# Patient Record
Sex: Female | Born: 1975 | Race: Black or African American | Hispanic: No | Marital: Married | Smoking: Never smoker
Health system: Southern US, Community
[De-identification: ages and names within clinical notes are randomized; demographics above are authoritative.]

## PROBLEM LIST (undated history)

## (undated) DIAGNOSIS — K219 Gastro-esophageal reflux disease without esophagitis: Secondary | ICD-10-CM

## (undated) DIAGNOSIS — R51 Headache: Secondary | ICD-10-CM

## (undated) DIAGNOSIS — Z87442 Personal history of urinary calculi: Secondary | ICD-10-CM

## (undated) DIAGNOSIS — E119 Type 2 diabetes mellitus without complications: Secondary | ICD-10-CM

## (undated) DIAGNOSIS — D649 Anemia, unspecified: Secondary | ICD-10-CM

## (undated) DIAGNOSIS — R519 Headache, unspecified: Secondary | ICD-10-CM

## (undated) DIAGNOSIS — J45909 Unspecified asthma, uncomplicated: Secondary | ICD-10-CM

## (undated) DIAGNOSIS — M1990 Unspecified osteoarthritis, unspecified site: Secondary | ICD-10-CM

## (undated) DIAGNOSIS — I10 Essential (primary) hypertension: Secondary | ICD-10-CM

## (undated) HISTORY — PX: DIAGNOSTIC LAPAROSCOPY: SUR761

## (undated) HISTORY — PX: CHOLECYSTECTOMY: SHX55

## (undated) HISTORY — PX: OTHER SURGICAL HISTORY: SHX169

## (undated) HISTORY — PX: ABDOMINAL HYSTERECTOMY: SHX81

## (undated) HISTORY — PX: HERNIA REPAIR: SHX51

## (undated) HISTORY — PX: CARDIAC CATHETERIZATION: SHX172

## (undated) HISTORY — DX: Essential (primary) hypertension: I10

## (undated) HISTORY — PX: SINUS SURGERY WITH INSTATRAK: SHX5215

## (undated) HISTORY — DX: Unspecified asthma, uncomplicated: J45.909

## (undated) HISTORY — DX: Type 2 diabetes mellitus without complications: E11.9

---

## 2016-07-24 ENCOUNTER — Other Ambulatory Visit (HOSPITAL_COMMUNITY): Payer: Self-pay | Admitting: Surgery

## 2016-08-01 ENCOUNTER — Ambulatory Visit (HOSPITAL_COMMUNITY)
Admission: RE | Admit: 2016-08-01 | Discharge: 2016-08-01 | Disposition: A | Discharge status: Home / Self Care | Payer: BC Managed Care – PPO | Source: Ambulatory Visit | Attending: Surgery | Admitting: Surgery

## 2016-08-01 ENCOUNTER — Other Ambulatory Visit: Payer: Self-pay

## 2016-08-01 DIAGNOSIS — Z01818 Encounter for other preprocedural examination: Secondary | ICD-10-CM | POA: Insufficient documentation

## 2016-08-15 ENCOUNTER — Encounter: Payer: BC Managed Care – PPO | Attending: Surgery | Admitting: Registered"

## 2016-08-15 ENCOUNTER — Encounter: Payer: Self-pay | Admitting: Registered"

## 2016-08-15 DIAGNOSIS — Z01818 Encounter for other preprocedural examination: Secondary | ICD-10-CM | POA: Diagnosis not present

## 2016-08-15 DIAGNOSIS — E119 Type 2 diabetes mellitus without complications: Secondary | ICD-10-CM | POA: Insufficient documentation

## 2016-08-15 DIAGNOSIS — K219 Gastro-esophageal reflux disease without esophagitis: Secondary | ICD-10-CM | POA: Insufficient documentation

## 2016-08-15 DIAGNOSIS — I10 Essential (primary) hypertension: Secondary | ICD-10-CM | POA: Diagnosis not present

## 2016-08-15 DIAGNOSIS — E669 Obesity, unspecified: Secondary | ICD-10-CM

## 2016-08-15 DIAGNOSIS — Z713 Dietary counseling and surveillance: Secondary | ICD-10-CM | POA: Insufficient documentation

## 2016-08-15 DIAGNOSIS — Z6841 Body Mass Index (BMI) 40.0 and over, adult: Secondary | ICD-10-CM | POA: Insufficient documentation

## 2016-08-15 NOTE — Progress Notes (Signed)
Pre-Op Assessment Visit:  Pre-Operative RYGB Surgery  Medical Nutrition Therapy:  Appt start time: 11:05  End time:  12:15  Patient was seen on 08/15/2016 for Pre-Operative Nutrition Assessment. Assessment and letter of approval [facility] to [facility] coordinator on 08/15/2016.   Pt expectation of surgery: to be more active, healthier to cause remission of diabetes  Pt expectation of Dietitian: help to stabilize blood sugar number's maintain stabilization  Start weight at [facility]: 248.5 BMI: 42.99   Pt arrived late to appt reporting that she ran into traffic traveling from [place] and got lost on where to come for today's visit. Pt states she has had diabetes since 2005. Pt reports her A1c has increased from 7.4 to 11 and currently on new medications to help manage her diabetes better. Pt reports her blood sugar dropped this week to 40's. Pt states she checks her blood sugar 4 times a day: FBS (130-140) and after meals (180-220).  Pt states she hasn't been able to get into the gym and walking as physical activity is limited due to back injury.  Pt reports sweet tea is her weakness. Pt states new diabetic pill (Januvia) makes her drink a lot of water. Pt states she used to skip dinner now that she's taking more medications it makes her eat more. 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   Pt is unsure of how many visits she needs with us prior to surgery.    24 hr Dietary Recall: First Meal: apple, grilled chicken breast, grits, eggs Snack: none Second Meal: grilled chicken salad, burger, fries Snack: none Third Meal: meat, 2 vegetables Snack: none Beverages: water, juice, sweet tea  Encouraged to engage in 150 minutes of moderate physical activity including cardiovascular and weight baring weekly  Handouts given during visit include:  . Pre-Op Goals . Bariatric Surgery Protein Shakes . Vitamin and Mineral  Supplements  During the appointment today the following Pre-Op Goals were reviewed with the patient: . Maintain or lose weight as instructed by your surgeon . Make healthy food choices . Begin to limit portion sizes . Limited concentrated sugars and fried foods . Keep fat/sugar in the single digits per serving on          food labels . Practice CHEWING your food  (aim for 30 chews per bite or until applesauce consistency) . Practice not drinking 15 minutes before, during, and 30 minutes after each meal/snack . Avoid all carbonated beverages  . Avoid/limit caffeinated beverages  . Avoid all sugar-sweetened beverages . Consume 3 meals per day; eat every 3-5 hours . Make a list of non-food related activities . Aim for 64-100 ounces of FLUID daily  . Aim for at least 60-80 grams of PROTEIN daily . Look for a liquid protein source that contain ?15 g protein and ?5 g carbohydrate  (ex: shakes, drinks, shots)   Goals: - Look at your schedule and find consistent times that you will be able to eat/ take vitamins and supplements 5 times a day.  Follow diet recommendations listed below  Energy and Macronutrient Recomendations: Calories: 1600 Carbohydrate: 180 Protein: 120 Fat: 44  Demonstrated degree of understanding via:  Teach Back   Teaching Method Utilized:  Visual Auditory Hands on  Barriers to learning/adherence to lifestyle change: work schedule  Patient to call the [facility] to enroll in Pre-Op and Post-Op Nutrition Education when surgery date is scheduled.

## 2016-08-15 NOTE — Patient Instructions (Signed)
-   Look at your schedule and find consistent times that you will be able to eat/ take vitamins and supplements 5 times a day.

## 2016-10-29 NOTE — Progress Notes (Signed)
Please place orders in EPIC as patient is being scheduled for a pre-op appointment! Thank you! 

## 2016-11-04 ENCOUNTER — Encounter: Payer: BC Managed Care – PPO | Attending: Surgery | Admitting: Skilled Nursing Facility1

## 2016-11-04 DIAGNOSIS — I10 Essential (primary) hypertension: Secondary | ICD-10-CM | POA: Diagnosis not present

## 2016-11-04 DIAGNOSIS — K219 Gastro-esophageal reflux disease without esophagitis: Secondary | ICD-10-CM | POA: Diagnosis not present

## 2016-11-04 DIAGNOSIS — Z6841 Body Mass Index (BMI) 40.0 and over, adult: Secondary | ICD-10-CM | POA: Diagnosis not present

## 2016-11-04 DIAGNOSIS — Z01818 Encounter for other preprocedural examination: Secondary | ICD-10-CM | POA: Insufficient documentation

## 2016-11-04 DIAGNOSIS — E119 Type 2 diabetes mellitus without complications: Secondary | ICD-10-CM

## 2016-11-04 DIAGNOSIS — Z713 Dietary counseling and surveillance: Secondary | ICD-10-CM | POA: Diagnosis not present

## 2016-11-05 ENCOUNTER — Encounter: Payer: Self-pay | Admitting: Skilled Nursing Facility1

## 2016-11-05 NOTE — Progress Notes (Signed)
Pre-Operative Nutrition Class:  Appt start time: 5248   End time:  1830.  Patient was seen on 11/04/2016 for Pre-Operative Bariatric Surgery Education [facility].   Pt arrived 1 hours late for class stating she got lost. When dietitian calmly explained she would have to be rescheduled due to her having missed very important information about medications she became hysterical: crying and kicking the wall. Dietitian calmed/conforted her and told her she could come into class at that point she calmed down easily and sat in class without issue.   Surgery date: 11/18/2016 Surgery type: RYGB Start weight at [facility]: 248.5 Weight today: pt arrived too late  Samples given per MNT protocol. Patient educated on appropriate usage: Bariatric Advantage Calcium  Lot # [id number] Exp: dec-11-2016  Celebrate Vitamins Multivitamin Lot # [id number] Exp: 09/2017  Renee Pain Protein Shake Lot # [id number] Exp: nov-05-2016  The following the learning objectives were met by the patient during this course:  Identify Pre-Op Dietary Goals and will begin 2 weeks pre-operatively  Identify appropriate sources of fluids and proteins   State protein recommendations and appropriate sources pre and post-operatively  Identify Post-Operative Dietary Goals and will follow for 2 weeks post-operatively  Identify appropriate multivitamin and calcium sources  Describe the need for physical activity post-operatively and will follow MD recommendations  State when to call healthcare provider regarding medication questions or post-operative complications  Handouts given during class include:  Pre-Op Bariatric Surgery Diet Handout  Protein Shake Handout  Post-Op Bariatric Surgery Nutrition Handout  [facility] Program Information Flyer  Support Group Information Flyer  [facility] Bariatric Supplements Price List  Follow-Up Plan: [facility] diet advancement per MD.

## 2016-11-08 NOTE — Progress Notes (Signed)
Please place orders in EPIC as patient has a pre-op appointment on 11/13/2016! Thank you!

## 2016-11-11 ENCOUNTER — Ambulatory Visit: Payer: Self-pay | Admitting: Surgery

## 2016-11-11 NOTE — Patient Instructions (Signed)
Margarette CanadaShektha W Bashore  11/11/2016   Your procedure is scheduled on: 11/18/2016    Report to [facility] Main  Entrance [place]  elevators to 3rd floor to  Short Stay Center at   0515 AM.    Call this number if you have problems the morning of surgery [phone]    Remember: ONLY 1 PERSON MAY GO WITH YOU TO SHORT STAY TO GET  READY MORNING OF YOUR SURGERY.  Do not eat food or drink liquids :After Midnight.     Take these medicines the morning of surgery with A SIP OF WATER: aMLODIPINE ( NORVASC), nASAL SPRAY, iMDUR, LINZESS, pROTONIX, PRILOSEC, METOPROLOL ( lOPRESSOR)  DO NOT TAKE ANY DIABETIC MEDICATIONS DAY OF YOUR SURGERY                               You may not have any metal on your body including hair pins and              piercings  Do not wear jewelry, make-up, lotions, powders or perfumes, deodorant             Do not wear nail polish.  Do not shave  48 hours prior to surgery.                 Do not bring valuables to the hospital. [facility] IS NOT             RESPONSIBLE   FOR VALUABLES.  Contacts, dentures or bridgework may not be worn into surgery.  Leave suitcase in the car. After surgery it may be brought to your room.                       Please read over the following fact sheets you were given: _____________________________________________________________________             [facility] - Preparing for Surgery Before surgery, you can play an important role.  Because skin is not sterile, your skin needs to be as free of germs as possible.  You can reduce the number of germs on your skin by washing with CHG (chlorahexidine gluconate) soap before surgery.  CHG is an antiseptic cleaner which kills germs and bonds with the skin to continue killing germs even after washing. Please DO NOT use if you have an allergy to CHG or antibacterial soaps.  If your skin becomes reddened/irritated stop using the CHG and inform your nurse when you arrive  at Short Stay. Do not shave (including legs and underarms) for at least 48 hours prior to the first CHG shower.  You may shave your face/neck. Please follow these instructions carefully:  1.  Shower with CHG Soap the night before surgery and the  morning of Surgery.  2.  If you choose to wash your hair, wash your hair first as usual with your  normal  shampoo.  3.  After you shampoo, rinse your hair and body thoroughly to remove the  shampoo.                           4.  Use CHG as you would any other liquid soap.  You can apply chg directly  to the skin and wash  Gently with a scrungie or clean washcloth.  5.  Apply the CHG Soap to your body ONLY FROM THE NECK DOWN.   Do not use on face/ open                           Wound or open sores. Avoid contact with eyes, ears mouth and genitals (private parts).                       Wash face,  Genitals (private parts) with your normal soap.             6.  Wash thoroughly, paying special attention to the area where your surgery  will be performed.  7.  Thoroughly rinse your body with warm water from the neck down.  8.  DO NOT shower/wash with your normal soap after using and rinsing off  the CHG Soap.                9.  Pat yourself dry with a clean towel.            10.  Wear clean pajamas.            11.  Place clean sheets on your bed the night of your first shower and do not  sleep with pets. Day of Surgery : Do not apply any lotions/deodorants the morning of surgery.  Please wear clean clothes to the hospital/surgery center.  FAILURE TO FOLLOW THESE INSTRUCTIONS MAY RESULT IN THE CANCELLATION OF YOUR SURGERY PATIENT SIGNATURE_________________________________  NURSE SIGNATURE__________________________________  ________________________________________________________________________ How to Manage Your Diabetes Before and After Surgery  Why is it important to control my blood sugar before and after surgery? . Improving  blood sugar levels before and after surgery helps healing and can limit problems. . A way of improving blood sugar control is eating a healthy diet by: o  Eating less sugar and carbohydrates o  Increasing activity/exercise o  Talking with your doctor about reaching your blood sugar goals . High blood sugars (greater than 180 mg/dL) can raise your risk of infections and slow your recovery, so you will need to focus on controlling your diabetes during the weeks before surgery. . Make sure that the doctor who takes care of your diabetes knows about your planned surgery including the date and location.  How do I manage my blood sugar before surgery? . Check your blood sugar at least 4 times a day, starting 2 days before surgery, to make sure that the level is not too high or low. o Check your blood sugar the morning of your surgery when you wake up and every 2 hours until you get to the Short Stay unit. . If your blood sugar is less than 70 mg/dL, you will need to treat for low blood sugar: o Do not take insulin. o Treat a low blood sugar (less than 70 mg/dL) with  cup of clear juice (cranberry or apple), 4 glucose tablets, OR glucose gel. o Recheck blood sugar in 15 minutes after treatment (to make sure it is greater than 70 mg/dL). If your blood sugar is not greater than 70 mg/dL on recheck, call [phone] for further instructions. . Report your blood sugar to the short stay nurse when you get to Short Stay.  . If you are admitted to the hospital after surgery: o Your blood sugar will be checked by the staff and you will probably be  given insulin after surgery (instead of oral diabetes medicines) to make sure you have good blood sugar levels. o The goal for blood sugar control after surgery is 80-180 mg/dL.   WHAT DO I DO ABOUT MY DIABETES MEDICATION?  Marland Kitchen. Do not take oral diabetes medicines (pills) the morning of surgery.  . THE NIGHT BEFORE SURGERY, take     units of        insulin.       . THE MORNING OF SURGERY, take   units of         insulin.  . The day of surgery, do not take other diabetes injectables, including Byetta (exenatide), Bydureon (exenatide ER), Victoza (liraglutide), or Trulicity (dulaglutide).  .    Patient Signature:  Date:   Nurse Signature:  Date:   Reviewed and Endorsed by [facility] Patient Education Committee, August 2015

## 2016-11-13 ENCOUNTER — Encounter (HOSPITAL_COMMUNITY): Payer: Self-pay

## 2016-11-13 ENCOUNTER — Encounter (HOSPITAL_COMMUNITY)
Admission: RE | Admit: 2016-11-13 | Discharge: 2016-11-13 | Disposition: A | Discharge status: Home / Self Care | Payer: BC Managed Care – PPO | Source: Ambulatory Visit | Attending: Surgery | Admitting: Surgery

## 2016-11-13 DIAGNOSIS — Z01818 Encounter for other preprocedural examination: Secondary | ICD-10-CM | POA: Insufficient documentation

## 2016-11-13 HISTORY — DX: Unspecified osteoarthritis, unspecified site: M19.90

## 2016-11-13 HISTORY — DX: Headache, unspecified: R51.9

## 2016-11-13 HISTORY — DX: Headache: R51

## 2016-11-13 HISTORY — DX: Personal history of urinary calculi: Z87.442

## 2016-11-13 HISTORY — DX: Anemia, unspecified: D64.9

## 2016-11-13 HISTORY — DX: Gastro-esophageal reflux disease without esophagitis: K21.9

## 2016-11-13 LAB — CBC WITH DIFFERENTIAL/PLATELET
BASOS ABS: 0 10*3/uL (ref 0.0–0.1)
Basophils Relative: 0 %
EOS PCT: 1 %
Eosinophils Absolute: 0.1 10*3/uL (ref 0.0–0.7)
HEMATOCRIT: 41.3 % (ref 36.0–46.0)
Hemoglobin: 13.9 g/dL (ref 12.0–15.0)
LYMPHS PCT: 28 %
Lymphs Abs: 3 10*3/uL (ref 0.7–4.0)
MCH: 28.9 pg (ref 26.0–34.0)
MCHC: 33.7 g/dL (ref 30.0–36.0)
MCV: 85.9 fL (ref 78.0–100.0)
MONO ABS: 0.5 10*3/uL (ref 0.1–1.0)
MONOS PCT: 4 %
NEUTROS ABS: 7.2 10*3/uL (ref 1.7–7.7)
Neutrophils Relative %: 67 %
PLATELETS: 323 10*3/uL (ref 150–400)
RBC: 4.81 MIL/uL (ref 3.87–5.11)
RDW: 13.5 % (ref 11.5–15.5)
WBC: 10.8 10*3/uL — ABNORMAL HIGH (ref 4.0–10.5)

## 2016-11-13 LAB — COMPREHENSIVE METABOLIC PANEL
ALT: 17 U/L (ref 14–54)
ANION GAP: 7 (ref 5–15)
AST: 18 U/L (ref 15–41)
Albumin: 4.2 g/dL (ref 3.5–5.0)
Alkaline Phosphatase: 54 U/L (ref 38–126)
BILIRUBIN TOTAL: 0.5 mg/dL (ref 0.3–1.2)
BUN: 8 mg/dL (ref 6–20)
CHLORIDE: 105 mmol/L (ref 101–111)
CO2: 27 mmol/L (ref 22–32)
Calcium: 9.4 mg/dL (ref 8.9–10.3)
Creatinine, Ser: 0.63 mg/dL (ref 0.44–1.00)
Glucose, Bld: 83 mg/dL (ref 65–99)
POTASSIUM: 4 mmol/L (ref 3.5–5.1)
Sodium: 139 mmol/L (ref 135–145)
TOTAL PROTEIN: 7.5 g/dL (ref 6.5–8.1)

## 2016-11-13 LAB — GLUCOSE, CAPILLARY: GLUCOSE-CAPILLARY: 97 mg/dL (ref 65–99)

## 2016-11-13 NOTE — H&P (Signed)
C[facility] Patient #: [id number] DOB: 1975/06/16 Married / Language: English / Race: Black or African American Female  History of Present Illness The patient is a 41 year old female who presents for a bariatric Palmer evaluation. She is a school bus driver and is [facility] by internist Crystal CornfieldStephanie William HamburgerJan Palmer. She has been followed for DM type 2, HTN, GERD and constipation. She has tried many diets with limited success and is most frustrated by her DM. She has GER treated with Protonix. Her constipation has required Linzess. Her type II DM has been poorly controlled with insulin. This has contributed to her intertrigo for which she takes nystatin powder.   Prior Palmer includes lap chole, navel hernia, and four c sections. Her weight is 252 and her BMI is 42.6. I discussed both roux en Y and sleeve gastrectomy in detail and we arrived at a plan to perform roux en Y gastric bypass for her difficult to control DM and morbid obesity and have sleeve gastrectomy as a fall back position if her adhesions are too bad. For roux en Y gastric bypass November 18, 2016.     Past Surgical History  Foot Palmer  Bilateral. Knee Palmer  Right.  Diagnostic Studies History  Mammogram  never  Allergies  No Known Allergies   Medication History Amitiza (24MCG Capsule, Oral) Active. AmLODIPine Besylate (5MG  Tablet, Oral) Active. Benzonatate (200MG  Capsule, Oral) Active. Butalbital-APAP-Caffeine (50-325-40MG  Tablet, Oral) Active. Cyclobenzaprine HCl (10MG  Tablet, Oral) Active. Diclofenac Sodium (75MG  Tablet DR, Oral) Active. Fluconazole (150MG  Tablet, Oral) Active. Ibuprofen (600MG  Tablet, Oral) Active. Ipratropium Bromide (0.06% Solution, Nasal) Active. Isosorbide Mononitrate ER (60MG  Tablet ER 24HR, Oral) Active. Janumet XR (50-1000MG  Tablet ER 24HR, Oral) Active. Jardiance (25MG  Tablet, Oral) Active. Linzess (290MCG Capsule, Oral)  Active. Lisinopril (40MG  Tablet, Oral) Active. Metoprolol Tartrate (50MG  Tablet, Oral) Active. Nitroglycerin (0.4MG  Tab Sublingual, Sublingual) Active. NovoLOG FlexPen (100UNIT/ML Soln Pen-inj, Subcutaneous) Active. Nystatin (100000 UNIT/GM Powder, External) Active. Omeprazole (40MG  Capsule DR, Oral) Active. Ondansetron (4MG  Tablet Disint, Oral) Active. OneTouch Verio (In Vitro) Active. Oseltamivir Phosphate (75MG  Capsule, Oral) Active. Pantoprazole Sodium (40MG  Tablet DR, Oral) Active. Evaristo Buryresiba FlexTouch (200UNIT/ML Soln Pen-inj, Subcutaneous) Active. Medications Reconciled  Social History  No alcohol use  No drug use  Tobacco use  Never smoker.  Family History Heart Disease  Mother. Migraine Headache  Daughter.  Pregnancy / Birth History  Age at menarche  8 years. Gravida  3 Maternal age  43-20  Other Problems  Back Pain  Diabetes Mellitus  Gastroesophageal Reflux Disease     Review of Systems  General Not Present- Appetite Loss, Chills, Fatigue, Fever, Night Sweats, Weight Gain and Weight Loss. Skin Present- Dryness. Not Present- Change in Wart/Mole, Hives, Jaundice, New Lesions, Non-Healing Wounds, Rash and Ulcer. HEENT Present- Seasonal Allergies. Not Present- Earache, Hearing Loss, Hoarseness, Nose Bleed, Oral Ulcers, Ringing in the Ears, Sinus Pain, Sore Throat, Visual Disturbances, Wears glasses/contact lenses and Yellow Eyes.  Vitals Weight: 252.6 lb Height: 64.5in Body Surface Area: 2.17 m Body Mass Index: 42.69 kg/m  Temp.: 98.64F  Pulse: 92 (Regular)  BP: 180/94 (Sitting, Left Arm, Standard)       Physical Exam  General Note: Morbidly obese AAF--BMI is 39 HEENT unremarkable Neck supple without masses Chest clear Heart SR without murmurs Abdomen protuberant and nontender GU not evaluated Ext FROM but with some lower extremity arthritis Neuro alert and oriented x 3     Assessment & Plan  DIABETES  (E11.9) HYPERTENSION (  I10) GERD (GASTROESOPHAGEAL REFLUX DISEASE) (K21.9) MORBID OBESITY, UNSPECIFIED OBESITY TYPE (E66.01) Impression: She is a good candidate for roux en Y gastric bypass for control of her diabetes. Plan gastric bypass with fallback for the sleeve gastrectomy in case we are unable to complete the gastric bypass--for roux en Y gastric bypass November 18, 2016.   Matt B. Daphine DeutscherMartin, MD, FACS

## 2016-11-13 NOTE — Progress Notes (Addendum)
CXR-08/01/16-epic  EKG-08/01/16-epic  10/31/16-LOV-PCP-epic  08/28/16-LOV with cardiology - Care Everywhere Cath 05/2016-Dr Crystal Palmer in Care Everywhere

## 2016-11-14 LAB — HEMOGLOBIN A1C
Hgb A1c MFr Bld: 10 % — ABNORMAL HIGH (ref 4.8–5.6)
Mean Plasma Glucose: 240 mg/dL

## 2016-11-14 NOTE — Progress Notes (Signed)
HGBA1C done 11/13/16 faxed via epic to DR Daphine DeutscherMartin

## 2016-11-17 NOTE — Anesthesia Preprocedure Evaluation (Addendum)
Anesthesia Evaluation  Patient identified by MRN, date of birth, ID band Patient awake    Reviewed: Allergy & Precautions, NPO status , Patient's Chart, lab work & pertinent test results  Airway Mallampati: II  TM Distance: >3 FB Neck ROM: Full    Dental no notable dental hx.    Pulmonary neg pulmonary ROS, asthma ,    Pulmonary exam normal breath sounds clear to auscultation       Cardiovascular hypertension, Pt. on medications negative cardio ROS Normal cardiovascular exam Rhythm:Regular Rate:Normal     Neuro/Psych  Headaches, negative neurological ROS  negative psych ROS   GI/Hepatic negative GI ROS, Neg liver ROS, GERD  ,  Endo/Other  negative endocrine ROSdiabetes, Type 2  Renal/GU negative Renal ROS  negative genitourinary   Musculoskeletal negative musculoskeletal ROS (+) Arthritis ,   Abdominal   Peds negative pediatric ROS (+)  Hematology negative hematology ROS (+) anemia ,   Anesthesia Other Findings   Reproductive/Obstetrics negative OB ROS                             Anesthesia Physical Anesthesia Plan  ASA: III  Anesthesia Plan: General   Post-op Pain Management:    Induction: Intravenous  PONV Risk Score and Plan: 2 and Ondansetron, Dexamethasone, Treatment may vary due to age or medical condition and Midazolam  Airway Management Planned: Oral ETT  Additional Equipment:   Intra-op Plan:   Post-operative Plan: Extubation in OR  Informed Consent: I have reviewed the patients History and Physical, chart, labs and discussed the procedure including the risks, benefits and alternatives for the proposed anesthesia with the patient or authorized representative who has indicated his/her understanding and acceptance.   Dental Advisory Given  Plan Discussed with:   Anesthesia Plan Comments: (Labs checked- platelets confirmed with RN in room. Fetal heart tracing, per  RN, reported to be stable enough for sitting procedure. Discussed epidural, and patient consents to the procedure:  included risk of possible headache,backache, failed block, allergic reaction, and nerve injury. This patient was asked if she had any questions or concerns before the procedure started.)        Anesthesia Quick Evaluation

## 2016-11-18 ENCOUNTER — Encounter (HOSPITAL_COMMUNITY): Payer: Self-pay | Admitting: *Deleted

## 2016-11-18 ENCOUNTER — Inpatient Hospital Stay (HOSPITAL_COMMUNITY)
Admission: RE | Disposition: A | Discharge status: Home / Self Care | Payer: Self-pay | Source: Ambulatory Visit | Attending: Surgery

## 2016-11-18 ENCOUNTER — Inpatient Hospital Stay (HOSPITAL_COMMUNITY)
Admission: RE | Admit: 2016-11-18 | Discharge: 2016-11-20 | DRG: 621 | Disposition: A | Discharge status: Home / Self Care | Payer: BC Managed Care – PPO | Source: Ambulatory Visit | Attending: Surgery | Admitting: Surgery

## 2016-11-18 ENCOUNTER — Inpatient Hospital Stay (HOSPITAL_COMMUNITY): Payer: BC Managed Care – PPO | Admitting: Anesthesiology

## 2016-11-18 DIAGNOSIS — Z8249 Family history of ischemic heart disease and other diseases of the circulatory system: Secondary | ICD-10-CM | POA: Diagnosis not present

## 2016-11-18 DIAGNOSIS — E1165 Type 2 diabetes mellitus with hyperglycemia: Secondary | ICD-10-CM | POA: Diagnosis present

## 2016-11-18 DIAGNOSIS — I10 Essential (primary) hypertension: Secondary | ICD-10-CM | POA: Diagnosis present

## 2016-11-18 DIAGNOSIS — K219 Gastro-esophageal reflux disease without esophagitis: Secondary | ICD-10-CM | POA: Diagnosis present

## 2016-11-18 DIAGNOSIS — K5900 Constipation, unspecified: Secondary | ICD-10-CM | POA: Diagnosis present

## 2016-11-18 DIAGNOSIS — L304 Erythema intertrigo: Secondary | ICD-10-CM | POA: Diagnosis present

## 2016-11-18 DIAGNOSIS — Z6841 Body Mass Index (BMI) 40.0 and over, adult: Secondary | ICD-10-CM

## 2016-11-18 DIAGNOSIS — K660 Peritoneal adhesions (postprocedural) (postinfection): Secondary | ICD-10-CM | POA: Diagnosis present

## 2016-11-18 DIAGNOSIS — Z9884 Bariatric surgery status: Secondary | ICD-10-CM

## 2016-11-18 HISTORY — PX: GASTRIC ROUX-EN-Y: SHX5262

## 2016-11-18 LAB — CBC
HCT: 42.3 % (ref 36.0–46.0)
Hemoglobin: 14.3 g/dL (ref 12.0–15.0)
MCH: 29.2 pg (ref 26.0–34.0)
MCHC: 33.8 g/dL (ref 30.0–36.0)
MCV: 86.5 fL (ref 78.0–100.0)
PLATELETS: 321 10*3/uL (ref 150–400)
RBC: 4.89 MIL/uL (ref 3.87–5.11)
RDW: 13.6 % (ref 11.5–15.5)
WBC: 20.3 10*3/uL — ABNORMAL HIGH (ref 4.0–10.5)

## 2016-11-18 LAB — GLUCOSE, CAPILLARY
GLUCOSE-CAPILLARY: 221 mg/dL — AB (ref 65–99)
GLUCOSE-CAPILLARY: 249 mg/dL — AB (ref 65–99)
GLUCOSE-CAPILLARY: 244 mg/dL — AB (ref 65–99)
GLUCOSE-CAPILLARY: 200 mg/dL — AB (ref 65–99)
GLUCOSE-CAPILLARY: 216 mg/dL — AB (ref 65–99)
GLUCOSE-CAPILLARY: 174 mg/dL — AB (ref 65–99)

## 2016-11-18 LAB — CREATININE, SERUM
Creatinine, Ser: 0.78 mg/dL (ref 0.44–1.00)
GFR calc Af Amer: >60 mL/min (ref >60)
GFR calc non Af Amer: >60 mL/min (ref >60)

## 2016-11-18 LAB — HEMOGLOBIN AND HEMATOCRIT, BLOOD
HCT: 42.9 % (ref 36.0–46.0)
Hemoglobin: 14.4 g/dL (ref 12.0–15.0)

## 2016-11-18 SURGERY — LAPAROSCOPIC ROUX-EN-Y GASTRIC BYPASS WITH UPPER ENDOSCOPY
Anesthesia: General | Site: Abdomen

## 2016-11-18 MED ORDER — FENTANYL CITRATE (PF) 250 MCG/5ML IJ SOLN
INTRAMUSCULAR | Status: AC
  Filled 2016-11-18: qty 5

## 2016-11-18 MED ORDER — OXYCODONE HCL 5 MG/5ML PO SOLN
5.0000 mg | ORAL | Status: DC | PRN
  Administered 2016-11-19 (×3): 5 mg via ORAL
  Administered 2016-11-19 – 2016-11-20 (×6): 10 mg via ORAL
  Filled 2016-11-18 (×2): qty 10
  Filled 2016-11-18: qty 5
  Filled 2016-11-18 (×3): qty 10
  Filled 2016-11-18: qty 5
  Filled 2016-11-18: qty 10
  Filled 2016-11-18: qty 5

## 2016-11-18 MED ORDER — CELECOXIB 200 MG PO CAPS
400.0000 mg | ORAL_CAPSULE | ORAL | Status: AC
  Administered 2016-11-18: 400 mg via ORAL
  Filled 2016-11-18: qty 2

## 2016-11-18 MED ORDER — FENTANYL CITRATE (PF) 100 MCG/2ML IJ SOLN
25.0000 ug | INTRAMUSCULAR | Status: DC | PRN
Start: 2016-11-18 — End: 2016-11-18
  Administered 2016-11-18 (×2): 50 ug via INTRAVENOUS

## 2016-11-18 MED ORDER — TISSEEL 10 ML EX KIT
PACK | CUTANEOUS | Status: DC | PRN
  Administered 2016-11-18: 15 mL

## 2016-11-18 MED ORDER — LIDOCAINE 2% (20 MG/ML) 5 ML SYRINGE
INTRAMUSCULAR | Status: DC | PRN
  Administered 2016-11-18: 1.5 mg/kg/h via INTRAVENOUS

## 2016-11-18 MED ORDER — ONDANSETRON HCL 4 MG/2ML IJ SOLN
4.0000 mg | INTRAMUSCULAR | Status: DC | PRN
  Administered 2016-11-18 – 2016-11-20 (×6): 4 mg via INTRAVENOUS
  Filled 2016-11-18 (×6): qty 2

## 2016-11-18 MED ORDER — ONDANSETRON HCL 4 MG/2ML IJ SOLN: 4.0000 mg | Freq: Once | INTRAMUSCULAR | Status: DC | PRN

## 2016-11-18 MED ORDER — FENTANYL CITRATE (PF) 100 MCG/2ML IJ SOLN
INTRAMUSCULAR | Status: AC
  Filled 2016-11-18: qty 2

## 2016-11-18 MED ORDER — LIDOCAINE 2% (20 MG/ML) 5 ML SYRINGE
INTRAMUSCULAR | Status: AC
  Filled 2016-11-18: qty 5

## 2016-11-18 MED ORDER — PREMIER PROTEIN SHAKE
2.0000 [oz_av] | ORAL | Status: DC
  Administered 2016-11-19 – 2016-11-20 (×7): 2 [oz_av] via ORAL

## 2016-11-18 MED ORDER — METOPROLOL TARTRATE 5 MG/5ML IV SOLN: 5.0000 mg | Freq: Four times a day (QID) | INTRAVENOUS | Status: DC | PRN

## 2016-11-18 MED ORDER — PANTOPRAZOLE SODIUM 40 MG IV SOLR
40.0000 mg | Freq: Every day | INTRAVENOUS | Status: DC
  Administered 2016-11-18 – 2016-11-19 (×2): 40 mg via INTRAVENOUS
  Filled 2016-11-18 (×2): qty 40

## 2016-11-18 MED ORDER — ACETAMINOPHEN 160 MG/5ML PO SOLN
325.0000 mg | ORAL | Status: DC | PRN
  Administered 2016-11-19: 650 mg via ORAL
  Filled 2016-11-18 (×2): qty 20.3

## 2016-11-18 MED ORDER — HYDRALAZINE HCL 20 MG/ML IJ SOLN
10.0000 mg | INTRAMUSCULAR | Status: DC | PRN
  Filled 2016-11-18: qty 0.5

## 2016-11-18 MED ORDER — KETAMINE HCL 10 MG/ML IJ SOLN
INTRAMUSCULAR | Status: DC | PRN
  Administered 2016-11-18: 30 mg via INTRAVENOUS

## 2016-11-18 MED ORDER — ONDANSETRON HCL 4 MG/2ML IJ SOLN
INTRAMUSCULAR | Status: AC
  Filled 2016-11-18: qty 2

## 2016-11-18 MED ORDER — HEPARIN SODIUM (PORCINE) 5000 UNIT/ML IJ SOLN
5000.0000 [IU] | INTRAMUSCULAR | Status: AC
  Administered 2016-11-18: 5000 [IU] via SUBCUTANEOUS
  Filled 2016-11-18: qty 1

## 2016-11-18 MED ORDER — HEPARIN SODIUM (PORCINE) 5000 UNIT/ML IJ SOLN
5000.0000 [IU] | Freq: Three times a day (TID) | INTRAMUSCULAR | Status: DC
  Administered 2016-11-18 – 2016-11-20 (×5): 5000 [IU] via SUBCUTANEOUS
  Filled 2016-11-18 (×5): qty 1

## 2016-11-18 MED ORDER — POTASSIUM CHLORIDE IN NACL 20-0.9 MEQ/L-% IV SOLN
INTRAVENOUS | Status: DC
  Administered 2016-11-18: 1000 mL via INTRAVENOUS
  Administered 2016-11-19 (×2): 100 mL/h via INTRAVENOUS
  Administered 2016-11-20: 1000 mL via INTRAVENOUS
  Filled 2016-11-18 (×5): qty 1000

## 2016-11-18 MED ORDER — CEFOTETAN DISODIUM-DEXTROSE 2-2.08 GM-% IV SOLR
2.0000 g | INTRAVENOUS | Status: AC
  Administered 2016-11-18: 2 g via INTRAVENOUS

## 2016-11-18 MED ORDER — LACTATED RINGERS IV SOLN
INTRAVENOUS | Status: DC | PRN
  Administered 2016-11-18 (×2): via INTRAVENOUS

## 2016-11-18 MED ORDER — TISSEEL VH 10 ML EX KIT
PACK | CUTANEOUS | Status: AC
  Filled 2016-11-18: qty 1

## 2016-11-18 MED ORDER — PROPOFOL 10 MG/ML IV BOLUS
INTRAVENOUS | Status: AC
  Filled 2016-11-18: qty 20

## 2016-11-18 MED ORDER — SUGAMMADEX SODIUM 500 MG/5ML IV SOLN
INTRAVENOUS | Status: AC
  Filled 2016-11-18: qty 5

## 2016-11-18 MED ORDER — SCOPOLAMINE 1 MG/3DAYS TD PT72
1.0000 | MEDICATED_PATCH | TRANSDERMAL | Status: DC
  Administered 2016-11-18: 1.5 mg via TRANSDERMAL
  Filled 2016-11-18: qty 1

## 2016-11-18 MED ORDER — DEXAMETHASONE SODIUM PHOSPHATE 10 MG/ML IJ SOLN
INTRAMUSCULAR | Status: DC | PRN
  Administered 2016-11-18: 4 mg via INTRAVENOUS

## 2016-11-18 MED ORDER — ROCURONIUM BROMIDE 50 MG/5ML IV SOSY
PREFILLED_SYRINGE | INTRAVENOUS | Status: AC
  Filled 2016-11-18: qty 5

## 2016-11-18 MED ORDER — ACETAMINOPHEN 500 MG PO TABS
1000.0000 mg | ORAL_TABLET | ORAL | Status: AC
  Administered 2016-11-18: 1000 mg via ORAL
  Filled 2016-11-18: qty 2

## 2016-11-18 MED ORDER — INSULIN ASPART 100 UNIT/ML ~~LOC~~ SOLN
0.0000 [IU] | SUBCUTANEOUS | Status: DC
  Administered 2016-11-18: 4 [IU] via SUBCUTANEOUS
  Administered 2016-11-18: 7 [IU] via SUBCUTANEOUS
  Administered 2016-11-19 – 2016-11-20 (×4): 3 [IU] via SUBCUTANEOUS

## 2016-11-18 MED ORDER — FENTANYL CITRATE (PF) 100 MCG/2ML IJ SOLN
INTRAMUSCULAR | Status: DC | PRN
Start: 2016-11-18 — End: 2016-11-18
  Administered 2016-11-18: 50 ug via INTRAVENOUS
  Administered 2016-11-18 (×2): 100 ug via INTRAVENOUS
  Administered 2016-11-18 (×2): 50 ug via INTRAVENOUS

## 2016-11-18 MED ORDER — BUPIVACAINE LIPOSOME 1.3 % IJ SUSP
20.0000 mL | Freq: Once | INTRAMUSCULAR | Status: AC
  Administered 2016-11-18: 20 mL
  Filled 2016-11-18: qty 20

## 2016-11-18 MED ORDER — CHLORHEXIDINE GLUCONATE CLOTH 2 % EX PADS: 6.0000 | MEDICATED_PAD | Freq: Once | CUTANEOUS | Status: DC

## 2016-11-18 MED ORDER — DEXAMETHASONE SODIUM PHOSPHATE 10 MG/ML IJ SOLN
INTRAMUSCULAR | Status: AC
  Filled 2016-11-18: qty 1

## 2016-11-18 MED ORDER — CEFOTETAN DISODIUM-DEXTROSE 2-2.08 GM-% IV SOLR
INTRAVENOUS | Status: AC
  Filled 2016-11-18: qty 50

## 2016-11-18 MED ORDER — SODIUM CHLORIDE 0.9 % IJ SOLN
INTRAMUSCULAR | Status: AC
  Filled 2016-11-18: qty 10

## 2016-11-18 MED ORDER — DEXAMETHASONE SODIUM PHOSPHATE 4 MG/ML IJ SOLN: 4.0000 mg | INTRAMUSCULAR | Status: DC

## 2016-11-18 MED ORDER — 0.9 % SODIUM CHLORIDE (POUR BTL) OPTIME
TOPICAL | Status: DC | PRN
  Administered 2016-11-18: 1000 mL

## 2016-11-18 MED ORDER — METOPROLOL TARTRATE 50 MG PO TABS
50.0000 mg | ORAL_TABLET | Freq: Once | ORAL | Status: AC
  Administered 2016-11-18: 50 mg via ORAL
  Filled 2016-11-18: qty 1

## 2016-11-18 MED ORDER — MEPERIDINE HCL 50 MG/ML IJ SOLN: 6.2500 mg | INTRAMUSCULAR | Status: DC | PRN

## 2016-11-18 MED ORDER — ONDANSETRON HCL 4 MG/2ML IJ SOLN
INTRAMUSCULAR | Status: DC | PRN
  Administered 2016-11-18: 4 mg via INTRAVENOUS

## 2016-11-18 MED ORDER — APREPITANT 40 MG PO CAPS
40.0000 mg | ORAL_CAPSULE | ORAL | Status: AC
  Administered 2016-11-18: 40 mg via ORAL
  Filled 2016-11-18: qty 1

## 2016-11-18 MED ORDER — SUGAMMADEX SODIUM 500 MG/5ML IV SOLN
INTRAVENOUS | Status: DC | PRN
  Administered 2016-11-18: 250 mg via INTRAVENOUS

## 2016-11-18 MED ORDER — MIDAZOLAM HCL 2 MG/2ML IJ SOLN
INTRAMUSCULAR | Status: AC
  Filled 2016-11-18: qty 2

## 2016-11-18 MED ORDER — KETAMINE HCL 10 MG/ML IJ SOLN
INTRAMUSCULAR | Status: AC
  Filled 2016-11-18: qty 1

## 2016-11-18 MED ORDER — SODIUM CHLORIDE 0.9 % IJ SOLN
INTRAMUSCULAR | Status: DC | PRN
  Administered 2016-11-18: 10 mL

## 2016-11-18 MED ORDER — PROPOFOL 10 MG/ML IV BOLUS
INTRAVENOUS | Status: DC | PRN
  Administered 2016-11-18: 200 mg via INTRAVENOUS

## 2016-11-18 MED ORDER — INSULIN ASPART 100 UNIT/ML ~~LOC~~ SOLN
SUBCUTANEOUS | Status: DC | PRN
  Administered 2016-11-18 (×2): 8 [IU] via SUBCUTANEOUS

## 2016-11-18 MED ORDER — MORPHINE SULFATE (PF) 2 MG/ML IV SOLN
1.0000 mg | INTRAVENOUS | Status: DC | PRN
  Administered 2016-11-18 (×3): 2 mg via INTRAVENOUS
  Administered 2016-11-18: 1 mg via INTRAVENOUS
  Filled 2016-11-18 (×4): qty 1

## 2016-11-18 MED ORDER — ROCURONIUM BROMIDE 100 MG/10ML IV SOLN
INTRAVENOUS | Status: DC | PRN
  Administered 2016-11-18: 20 mg via INTRAVENOUS
  Administered 2016-11-18: 10 mg via INTRAVENOUS
  Administered 2016-11-18: 50 mg via INTRAVENOUS
  Administered 2016-11-18: 20 mg via INTRAVENOUS
  Administered 2016-11-18: 10 mg via INTRAVENOUS

## 2016-11-18 MED ORDER — MIDAZOLAM HCL 5 MG/5ML IJ SOLN
INTRAMUSCULAR | Status: DC | PRN
  Administered 2016-11-18: 2 mg via INTRAVENOUS

## 2016-11-18 MED ORDER — INSULIN ASPART 100 UNIT/ML ~~LOC~~ SOLN
SUBCUTANEOUS | Status: AC
  Filled 2016-11-18: qty 1

## 2016-11-18 MED ORDER — GABAPENTIN 300 MG PO CAPS
300.0000 mg | ORAL_CAPSULE | ORAL | Status: AC
  Administered 2016-11-18: 300 mg via ORAL
  Filled 2016-11-18: qty 1

## 2016-11-18 MED ORDER — LIDOCAINE 2% (20 MG/ML) 5 ML SYRINGE
INTRAMUSCULAR | Status: AC
  Filled 2016-11-18: qty 10

## 2016-11-18 MED ORDER — LACTATED RINGERS IR SOLN
Status: DC | PRN
  Administered 2016-11-18: 1000 mL

## 2016-11-18 MED ORDER — ACETAMINOPHEN 325 MG PO TABS
650.0000 mg | ORAL_TABLET | ORAL | Status: DC | PRN
  Filled 2016-11-18: qty 2

## 2016-11-18 SURGICAL SUPPLY — 67 items
APPLIER CLIP ROT 10 11.4 M/L (STAPLE)
APPLIER CLIP ROT 13.4 12 LRG (CLIP)
BENZOIN TINCTURE PRP APPL 2/3 (GAUZE/BANDAGES/DRESSINGS) IMPLANT
BLADE SURG 15 STRL LF DISP TIS (BLADE) ×1 IMPLANT
BLADE SURG 15 STRL SS (BLADE) ×1
CABLE HIGH FREQUENCY MONO STRZ (ELECTRODE) ×2 IMPLANT
CLIP APPLIE ROT 10 11.4 M/L (STAPLE) IMPLANT
CLIP APPLIE ROT 13.4 12 LRG (CLIP) IMPLANT
CLIP SUT LAPRA TY ABSORB (SUTURE) ×4 IMPLANT
DERMABOND ADVANCED (GAUZE/BANDAGES/DRESSINGS) ×1
DERMABOND ADVANCED .7 DNX12 (GAUZE/BANDAGES/DRESSINGS) ×1 IMPLANT
DEVICE SUT QUICK LOAD TK 5 (STAPLE) IMPLANT
DEVICE SUT TI-KNOT TK 5X26 (MISCELLANEOUS) IMPLANT
DEVICE SUTURE ENDOST 10MM (ENDOMECHANICALS) ×2 IMPLANT
DISSECTOR BLUNT TIP ENDO 5MM (MISCELLANEOUS) IMPLANT
DRAIN PENROSE 18X1/4 LTX STRL (WOUND CARE) ×2 IMPLANT
ELECT PENCIL ROCKER SW 15FT (MISCELLANEOUS) ×2 IMPLANT
GAUZE SPONGE 4X4 12PLY STRL (GAUZE/BANDAGES/DRESSINGS) IMPLANT
GAUZE SPONGE 4X4 16PLY XRAY LF (GAUZE/BANDAGES/DRESSINGS) ×2 IMPLANT
GLOVE BIOGEL M 8.0 STRL (GLOVE) ×2 IMPLANT
GOWN STRL REUS W/TWL XL LVL3 (GOWN DISPOSABLE) ×8 IMPLANT
HANDLE STAPLE EGIA 4 XL (STAPLE) ×2 IMPLANT
HOVERMATT SINGLE USE (MISCELLANEOUS) ×2 IMPLANT
KIT BASIN OR (CUSTOM PROCEDURE TRAY) ×2 IMPLANT
KIT GASTRIC LAVAGE 34FR ADT (SET/KITS/TRAYS/PACK) ×2 IMPLANT
MARKER SKIN DUAL TIP RULER LAB (MISCELLANEOUS) ×2 IMPLANT
NEEDLE SPNL 22GX3.5 QUINCKE BK (NEEDLE) ×2 IMPLANT
PACK CARDIOVASCULAR III (CUSTOM PROCEDURE TRAY) ×2 IMPLANT
RELOAD EGIA 45 MED/THCK PURPLE (STAPLE) ×2 IMPLANT
RELOAD EGIA 45 TAN VASC (STAPLE) ×2 IMPLANT
RELOAD EGIA 60 MED/THCK PURPLE (STAPLE) ×2 IMPLANT
RELOAD EGIA 60 TAN VASC (STAPLE) ×2 IMPLANT
RELOAD ENDO STITCH 2.0 (ENDOMECHANICALS) ×9
RELOAD TRI 45 ART MED THCK PUR (STAPLE) IMPLANT
RELOAD TRI 60 ART MED THCK PUR (STAPLE) ×6 IMPLANT
SCISSORS LAP 5X45 EPIX DISP (ENDOMECHANICALS) ×2 IMPLANT
SEALANT SURGICAL APPL DUAL CAN (MISCELLANEOUS) ×2 IMPLANT
SET IRRIG TUBING LAPAROSCOPIC (IRRIGATION / IRRIGATOR) ×2 IMPLANT
SHEARS HARMONIC ACE PLUS 45CM (MISCELLANEOUS) ×2 IMPLANT
SLEEVE ADV FIXATION 12X100MM (TROCAR) ×4 IMPLANT
SLEEVE ADV FIXATION 5X100MM (TROCAR) IMPLANT
SOLUTION ANTI FOG 6CC (MISCELLANEOUS) ×2 IMPLANT
STAPLER VISISTAT 35W (STAPLE) ×2 IMPLANT
STRIP CLOSURE SKIN 1/2X4 (GAUZE/BANDAGES/DRESSINGS) IMPLANT
SUT MNCRL AB 4-0 PS2 18 (SUTURE) ×8 IMPLANT
SUT RELOAD ENDO STITCH 2 48X1 (ENDOMECHANICALS) ×5
SUT RELOAD ENDO STITCH 2.0 (ENDOMECHANICALS) ×4
SUT SURGIDAC NAB ES-9 0 48 120 (SUTURE) IMPLANT
SUT VIC AB 2-0 SH 27 (SUTURE) ×1
SUT VIC AB 2-0 SH 27X BRD (SUTURE) ×1 IMPLANT
SUT VIC AB 4-0 SH 18 (SUTURE) ×2 IMPLANT
SUT VICRYL 0 UR6 27IN ABS (SUTURE) ×4 IMPLANT
SUTURE RELOAD END STTCH 2 48X1 (ENDOMECHANICALS) ×5 IMPLANT
SUTURE RELOAD ENDO STITCH 2.0 (ENDOMECHANICALS) ×4 IMPLANT
SYR 10ML ECCENTRIC (SYRINGE) ×2 IMPLANT
SYR 20CC LL (SYRINGE) ×4 IMPLANT
SYR 50ML LL SCALE MARK (SYRINGE) ×2 IMPLANT
TOWEL OR 17X26 10 PK STRL BLUE (TOWEL DISPOSABLE) ×4 IMPLANT
TOWEL OR NON WOVEN STRL DISP B (DISPOSABLE) ×2 IMPLANT
TRAY FOLEY CATH 14FRSI W/METER (CATHETERS) ×2 IMPLANT
TROCAR ADV FIXATION 12X100MM (TROCAR) ×2 IMPLANT
TROCAR ADV FIXATION 5X100MM (TROCAR) ×2 IMPLANT
TROCAR BLADELESS OPT 5 100 (ENDOMECHANICALS) ×2 IMPLANT
TROCAR XCEL 12X100 BLDLESS (ENDOMECHANICALS) ×2 IMPLANT
TUBING CONNECTING 10 (TUBING) ×4 IMPLANT
TUBING ENDO SMARTCAP PENTAX (MISCELLANEOUS) ×2 IMPLANT
TUBING INSUF HEATED (TUBING) ×2 IMPLANT

## 2016-11-18 NOTE — Transfer of Care (Signed)
Immediate Anesthesia Transfer of Care Note  Patient: Crystal Palmer  Procedure(s) Performed: Procedure(s): LAPAROSCOPIC ROUX-EN-Y GASTRIC BYPASS WITH UPPER ENDOSCOPY (N/A)  Patient Location: PACU  Anesthesia Type:General  Level of Consciousness: sedated  Airway & Oxygen Therapy: Patient Spontanous Breathing and Patient connected to face mask oxygen  Post-op Assessment: Report given to RN and Post -op Vital signs reviewed and stable  Post vital signs: Reviewed and stable  Last Vitals:  Vitals:   11/18/16 0520  BP: (!) 151/71  Pulse: 72  Resp: 16  Temp: 36.8 C    Last Pain:  Vitals:   11/18/16 0547  TempSrc:   PainSc: 0-No pain      Patients Stated Pain Goal: 3 (11/18/16 0547)  Complications: No apparent anesthesia complications

## 2016-11-18 NOTE — Op Note (Addendum)
Surgeon: Pollyann SavoyMatt B. Daphine DeutscherMartin, MD, FACS Asst:  Ovidio Kinavid Newman, MD,FACS Anesthesia: General endotracheal Drains: None  Procedure: Laparoscopic Roux en Y gastric bypass with 40 cm BP limb and 100 cm Roux limb, antecolic, antegastric, candy cane to the left.  Closure of Peterson's defect. Upper endoscopy. Repair of umbilical hernia.   Description of Procedure:  [facility] and given general anesthesia.  The abdomen was prepped with PCMX and draped sterilely.  A time out was performed.  The patient had numerous adhesions to the umbilical area and these were lysed sharply and a convoluted hernia found-small fascia defect and larger sac.  At the end of the case, I cut down on this area, excised the sack and closed the small fascial defects with figure of eight sutures of 0 vicryl.    The operation began by identifying the ligament of Treitz. I measured 40 cm downstream and divided the bowel with a 6 cm Covidian stapler.  I sutured a Penrose drain along the Roux limb end.  I measured a 1 meter (100 cm) Roux limb and then placed the distal bowels to the BP limb side by side and performed a stapled jejunojejunostomy. The common defect was closed from either end with 4-0 Vicryl using the Endo Stitch. The mesenteric defect was closed with a running 2-0 silk using the Endo Stitch. Tisseel was applied to the suture line.  The omentum was divided with the harmonic scalpel.  The Nathanson retractor was inserted in the left lateral segment of liver was retracted. The foregut dissection ensued.  A small gastric pouch was created with the Covidien purple loads with the first two loads without TRS and the remainder with TRS.    The Roux limb was then brought up with the candycane pointed left and a back row of sutures of 2-0 Vicryl were placed. I opened along the right side of each structure and inserted the 4.5 cm stapler to create the gastrojejunostomy. The common defect was closed from either end with  2-0 Vicryl and a second row was placed anterior to that the Ewald tube acting as a stent across the anastomosis. The Penrose drain was removed. Peterson's defect was closed with 2-0 silk.   Endoscopy was performed by Dr. Ezzard StandingNewman.  No bubbles or bleeding were observed.    The incisions were injected with Exparel and were closed with 4-0 Monocryl and Dermabond.  The patient was taken to the recovery room in satisfactory condition.  Matt B. Daphine DeutscherMartin, MD, FACS

## 2016-11-18 NOTE — Progress Notes (Signed)
C/O being hot, temp WNL, removed all blankets, states he is very "hot natured", cool cloth to forehead

## 2016-11-18 NOTE — Discharge Instructions (Signed)
° ° ° °GASTRIC BYPASS/SLEEVE ° Home Care Instructions ° ° These instructions are to help you care for yourself when you go home. ° °Call: If you have any problems. °• Call [phone] and ask for the surgeon on call °• If you need immediate assistance come to the ER at [facility]. Tell the ER staff you are a new post-op gastric bypass or gastric sleeve patient  °Signs and symptoms to report: • Severe  vomiting or nausea °o If you cannot handle clear liquids for longer than 1 day, call your surgeon °• Abdominal pain which does not get better after taking your pain medication °• Fever greater than 100.4°  F and chills °• Heart rate over 100 beats a minute °• Trouble breathing °• Chest pain °• Redness,  swelling, drainage, or foul odor at incision (surgical) sites °• If your incisions open or pull apart °• Swelling or pain in calf (lower leg) °• Diarrhea (Loose bowel movements that happen often), frequent watery, uncontrolled bowel movements °• Constipation, (no bowel movements for 3 days) if this happens: °o Take Milk of Magnesia, 2 tablespoons by mouth, 3 times a day for 2 days if needed °o Stop taking Milk of Magnesia once you have had a bowel movement °o Call your doctor if constipation continues °Or °o Take Miralax  (instead of Milk of Magnesia) following the label instructions °o Stop taking Miralax once you have had a bowel movement °o Call your doctor if constipation continues °• Anything you think is “abnormal for you” °  °Normal side effects after surgery: • Unable to sleep at night or unable to concentrate °• Irritability °• Being tearful (crying) or depressed ° °These are common complaints, possibly related to your anesthesia, stress of surgery, and change in lifestyle, that usually go away a few weeks after surgery. If these feelings continue, call your medical doctor.  °Wound Care: You may have surgical glue, steri-strips, or staples over your incisions after surgery °• Surgical glue: Looks like clear  film over your incisions and will wear off a little at a time °• Steri-strips: Adhesive strips of tape over your incisions. You may notice a yellowish color on skin under the steri-strips. This is used to make the steri-strips stick better. Do not pull the steri-strips off - let them fall off °• Staples: Staples may be removed before you leave the hospital °o If you go home with staples, call [facility] for an appointment with your surgeon’s nurse to have staples removed 10 days after surgery, [phone] °• Showering: You may shower two (2) days after your surgery unless your surgeon tells you differently °o Wash gently around incisions with warm soapy water, rinse well, and gently pat dry °o If you have a drain (tube from your incision), you may need someone to hold this while you shower °o No tub baths until staples are removed and incisions are healed °  °Medications: • Medications should be liquid or crushed if larger than the size of a dime °• Extended release pills (medication that releases a little bit at a time through the  day) should not be crushed °• Depending on the size and number of medications you take, you may need to space (take a few throughout the day)/change the time you take your medications so that you do not over-fill your pouch (smaller stomach) °• Make sure you follow-up with you primary care physician to make medication changes needed during rapid weight loss and life -style changes °•   If you have diabetes, follow up with your doctor that orders your diabetes medication(s) within one week after surgery and check your blood sugar regularly ° °• Do not drive while taking narcotics (pain medications) ° °• Do not take acetaminophen (Tylenol) and Roxicet or Lortab Elixir at the same time since these pain medications contain acetaminophen °  °Diet:  °First 2 Weeks You will see the nutritionist about two (2) weeks after your surgery. The nutritionist will increase the types of  foods you can eat if you are handling liquids well: °• If you have severe vomiting or nausea and cannot handle clear liquids lasting longer than 1 day call your surgeon °Protein Shake °• Drink at least 2 ounces of shake 5-6 times per day °• Each serving of protein shakes (usually 8-12 ounces) should have a minimum of: °o 15 grams of protein °o And no more than 5 grams of carbohydrate °• Goal for protein each day: °o Men = 80 grams per day °o Women = 60 grams per day °  ° • Protein powder may be added to fluids such as non-fat milk or Lactaid milk or Soy milk (limit to 35 grams added protein powder per serving) ° °Hydration °• Slowly increase the amount of water and other clear liquids as tolerated (See Acceptable Fluids) °• Slowly increase the amount of protein shake as tolerated °• Sip fluids slowly and throughout the day °• May use sugar substitutes in small amounts (no more than 6-8 packets per day; i.e. Splenda) ° °Fluid Goal °• The first goal is to drink at least 8 ounces of protein shake/drink per day (or as directed by the nutritionist); some examples of protein shakes are Syntrax Nectar, Adkins Advantage, EAS Edge HP, and Unjury. - See handout from pre-op Bariatric Education Class: °o Slowly increase the amount of protein shake you drink as tolerated °o You may find it easier to slowly sip shakes throughout the day °o It is important to get your proteins in first °• Your fluid goal is to drink 64-100 ounces of fluid daily °o It may take a few weeks to build up to this  °• 32 oz. (or more) should be clear liquids °And °• 32 oz. (or more) should be full liquids (see below for examples) °• Liquids should not contain sugar, caffeine, or carbonation ° °Clear Liquids: °• Water of Sugar-free flavored water (i.e. Fruit H²O, Propel) °• Decaffeinated coffee or tea (sugar-free) °• Crystal lite, Wyler’s Lite, Minute Maid Lite °• Sugar-free Jell-O °• Bouillon or broth °• Sugar-free Popsicle:    - Less than 20 calories  each; Limit 1 per day ° °Full Liquids: °                  Protein Shakes/Drinks + 2 choices per day of other full liquids °• Full liquids must be: °o No More Than 12 grams of Carbs per serving °o No More Than 3 grams of Fat per serving °• Strained low-fat cream soup °• Non-Fat milk °• Fat-free Lactaid Milk °• Sugar-free yogurt (Dannon Lite & Fit, Greek yogurt) ° °  °Vitamins and Minerals • Start 1 day after surgery unless otherwise directed by your surgeon °• 2 Chewable Bariatric Multivitamin / Multimineral Supplement with iron °• Chewable Calcium Citrate with Vitamin D-3 °(Example: 3 Chewable Calcium  Plus 600 with Vitamin D-3) °o Take 500 mg three (3) times a day for a total of 1500 mg each day °o Do not take all 3 doses of calcium   at one time as it may cause constipation, and you can only absorb 500 mg at a time °o Do not mix multivitamins containing iron with calcium supplements;  take 2 hours apart °• Menstruating women and those at risk for anemia ( a blood disease that causes weakness) may need extra iron °o Talk to your doctor to see if you need more iron °• If you need extra iron: Total daily Iron recommendation (including Vitamins) is 50 to 100 mg Iron/day °• Do not stop taking or change any vitamins or minerals until you talk to your nutritionist or surgeon °• Your nutritionist and/or surgeon must approve all vitamin and mineral supplements °  °Activity and Exercise: It is important to continue walking at home. Limit your physical activity as instructed by your doctor. During this time, use these guidelines: °• Do not lift anything greater than ten  (10) pounds for at least two (2) weeks °• Do not go back to work or drive until your surgeon says you can °• You may have sex when you feel comfortable °o It is VERY important for female patients to use a reliable birth control method; fertility often increase after surgery °o Do not get pregnant for at least 18 months °• Start exercising as soon as your  doctor tells you that you can °o Make sure your doctor approves any physical activity °• Start with a simple walking program °• Walk 5-15 minutes each day, 7 days per week °• Slowly increase until you are walking 30-45 minutes per day °• Consider joining our BELT program. [phone] or email [email or web address] °  °Special Instructions Things to remember: °• Use your CPAP when sleeping if this applies to you °• Consider buying a medical alert bracelet that says you had lap-band surgery °  °  You will likely have your first fill (fluid added to your band) 6 - 8 weeks after surgery °• [facility] has a free Bariatric Surgery Support Group that meets monthly, the 3rd Thursday, 6pm. [facility] Classrooms. You can see classes online at [email or web address] °• It is very important to keep all follow up appointments with your surgeon, nutritionist, primary care physician, and behavioral health practitioner °o After the first year, please follow up with your bariatric surgeon and nutritionist at least once a year in order to maintain best weight loss results °      °             [facility]:  [phone] ° °             [facility]: [phone] ° °             Bariatric Nurse Coordinator: [phone]  °Gastric Bypass/Sleeve Home Care Instructions  Rev. 05/2012    ° °                                                    Reviewed and Endorsed °                                                   by [facility] Patient Education Committee, Jan, 2014 ° ° ° ° ° ° ° ° ° °

## 2016-11-18 NOTE — Anesthesia Procedure Notes (Signed)
Procedure Name: Intubation Date/Time: 11/18/2016 7:26 AM Performed by: Thornell MuleSTUBBLEFIELD, Matteson Blue G Pre-anesthesia Checklist: Patient identified, Emergency Drugs available, Suction available and Patient being monitored Patient Re-evaluated:Patient Re-evaluated prior to induction Oxygen Delivery Method: Circle system utilized Preoxygenation: Pre-oxygenation with 100% oxygen Induction Type: IV induction Ventilation: Mask ventilation without difficulty Laryngoscope Size: Miller and 3 Grade View: Grade I Tube type: Oral Tube size: 7.0 mm Number of attempts: 1 Airway Equipment and Method: Stylet and Oral airway Placement Confirmation: ETT inserted through vocal cords under direct vision,  positive ETCO2 and breath sounds checked- equal and bilateral Secured at: 20 cm Tube secured with: Tape Dental Injury: Teeth and Oropharynx as per pre-operative assessment

## 2016-11-18 NOTE — Op Note (Signed)
Name:  Crystal Palmer MRN: [id number] Date of Surgery: 11/18/2016  Preop Diagnosis:  Morbid Obesity, S/P RYGB  Postop Diagnosis:  Morbid Obesity, S/P RYGB (Weight - 252, BMI - 42.7)  Procedure:  Upper endoscopy  (Intraoperative)  Surgeon:  Ovidio Kinavid Durante Violett, M.D.  Anesthesia:  GET  Indications for procedure: Crystal Palmer is a 41 y.o. female whose primary care physician is Jerral RalphMyers, Stephanie J, MD and has completed a Roux-en-Y gastric bypass today by Dr. Daphine DeutscherMartin.  I am doing an intraoperative upper endoscopy to evaluate the gastric pouch and the gastro-jejunal anastomosis.  Operative Note: The patient is under general anesthesia.  Dr. Daphine DeutscherMartin is laparoscoping the patient while I do an upper endoscopy to evaluate the stomach pouch and gastrojejunal anastomosis.  With the patient intubated, I passed the Pentax endoscope without difficulty down the esophagus.  The esophago-gastric junction was at 39 cm.  The gastro-jejunal anastomosis was at 45 cm.  The mucosa of the stomach looked viable and the staple line was intact without bleeding.  The gastro-jejunal anastomosis looked okay.  While I insufflated the stomach pouch with air, Dr. Daphine DeutscherMartin clamped off the efferent limb of the jejunum.  He then flooded the upper abdomen with saline to put the gastric pouch and gastro-jejunal anastomosis under saline.  There was no bubbling or evidence of a leak.    The scope was then withdrawn.  The esophagus was unremarkable and the patient tolerated the endoscopy without difficulty.  Ovidio Kinavid Kashara Blocher, MD, [facility] Pager: [phone] Office phone:  [phone]

## 2016-11-18 NOTE — Progress Notes (Addendum)
Inpatient Diabetes Program Recommendations  AACE/ADA: New Consensus Statement on Inpatient Glycemic Control (2015)  Target Ranges:  Prepandial:   less than 140 mg/dL      Peak postprandial:   less than 180 mg/dL (1-2 hours)      Critically ill patients:  140 - 180 mg/dL   Lab Results  Component Value Date   GLUCAP 200 (H) 11/18/2016   HGBA1C 10.0 (H) 11/13/2016    Review of Glycemic Control  Diabetes history: DM2 Outpatient Diabetes medications: Novolog 5-10 units tidwc, Basaglar 80 units QD, Janumet 50/1000 mg QD Current orders for Inpatient glycemic control: Novolog 0-20 units Q4H  HgbA1C - 10%. Received Decadron 4 mg at 0750 this am.  Inpatient Diabetes Program Recommendations:    If blood sugars remain > 180 mg/dL, consider addition of Lantus 12 units Q24H.  For home: Check blood sugars at least 4x/day and f/u with MD who manages her DM for further recommendations.  Will follow.  Thank you. Crystal Palmer, RD, LDN, CDE Inpatient Diabetes Coordinator [phone]

## 2016-11-18 NOTE — Anesthesia Postprocedure Evaluation (Signed)
Anesthesia Post Note  Patient: Crystal Palmer  Procedure(s) Performed: Procedure(s) (LRB): LAPAROSCOPIC ROUX-EN-Y GASTRIC BYPASS WITH UPPER ENDOSCOPY (N/A)     Patient location during evaluation: PACU Anesthesia Type: General Level of consciousness: awake and alert Pain management: pain level controlled Vital Signs Assessment: post-procedure vital signs reviewed and stable Respiratory status: spontaneous breathing, nonlabored ventilation, respiratory function stable and patient connected to nasal cannula oxygen Cardiovascular status: blood pressure returned to baseline and stable Postop Assessment: no signs of nausea or vomiting Anesthetic complications: no    Last Vitals:  Vitals:   11/18/16 1300 11/18/16 1315  BP: (!) 176/86 (!) 172/89  Pulse: 99 100  Resp: 17 (!) 21  Temp: 36.9 C     Last Pain:  Vitals:   11/18/16 1315  TempSrc:   PainSc: Asleep                 Korie Streat

## 2016-11-18 NOTE — Interval H&P Note (Signed)
History and Physical Interval Note:  11/18/2016 7:14 AM  Crystal Palmer  has presented today for surgery, with the diagnosis of MORBID OBESITY  The various methods of treatment have been discussed with the patient and family. After consideration of risks, benefits and other options for treatment, the patient has consented to  Procedure(s): LAPAROSCOPIC ROUX-EN-Y GASTRIC BYPASS WITH UPPER ENDOSCOPY (N/A) as a surgical intervention .  The patient's history has been reviewed, patient examined, no change in status, stable for surgery.  I have reviewed the patient's chart and labs.  Questions were answered to the patient's satisfaction.     Delontae Lamm B

## 2016-11-19 LAB — GLUCOSE, CAPILLARY
GLUCOSE-CAPILLARY: 112 mg/dL — AB (ref 65–99)
GLUCOSE-CAPILLARY: 131 mg/dL — AB (ref 65–99)
Glucose-Capillary: 109 mg/dL — ABNORMAL HIGH (ref 65–99)
Glucose-Capillary: 115 mg/dL — ABNORMAL HIGH (ref 65–99)
Glucose-Capillary: 119 mg/dL — ABNORMAL HIGH (ref 65–99)
Glucose-Capillary: 138 mg/dL — ABNORMAL HIGH (ref 65–99)

## 2016-11-19 LAB — CBC WITH DIFFERENTIAL/PLATELET
BASOS PCT: 0 %
Basophils Absolute: 0 10*3/uL (ref 0.0–0.1)
Eosinophils Absolute: 0 10*3/uL (ref 0.0–0.7)
Eosinophils Relative: 0 %
HEMATOCRIT: 39.2 % (ref 36.0–46.0)
HEMOGLOBIN: 13 g/dL (ref 12.0–15.0)
LYMPHS ABS: 1.8 10*3/uL (ref 0.7–4.0)
Lymphocytes Relative: 13 %
MCH: 28.9 pg (ref 26.0–34.0)
MCHC: 33.2 g/dL (ref 30.0–36.0)
MCV: 87.1 fL (ref 78.0–100.0)
MONO ABS: 0.8 10*3/uL (ref 0.1–1.0)
MONOS PCT: 6 %
NEUTROS ABS: 10.9 10*3/uL — AB (ref 1.7–7.7)
NEUTROS PCT: 81 %
Platelets: 270 10*3/uL (ref 150–400)
RBC: 4.5 MIL/uL (ref 3.87–5.11)
RDW: 13.7 % (ref 11.5–15.5)
WBC: 13.6 10*3/uL — ABNORMAL HIGH (ref 4.0–10.5)

## 2016-11-19 MED ORDER — HYDRALAZINE HCL 20 MG/ML IJ SOLN
10.0000 mg | INTRAMUSCULAR | Status: DC | PRN
  Filled 2016-11-19: qty 0.5

## 2016-11-19 MED ORDER — METOPROLOL TARTRATE 5 MG/5ML IV SOLN: 5.0000 mg | Freq: Four times a day (QID) | INTRAVENOUS | Status: DC | PRN

## 2016-11-19 MED FILL — Fibrin Sealant Component Kit 10 ML: CUTANEOUS | Qty: 2 | Status: AC

## 2016-11-19 NOTE — Progress Notes (Signed)
Patient alert and oriented, Post op day 1.  Provided support and encouragement.  Encouraged pulmonary toilet, ambulation and small sips of liquids.  All questions answered.  Will continue to monitor. 

## 2016-11-19 NOTE — Progress Notes (Signed)
Patient ID: Crystal Palmer, female   DOB: 1975-12-14, 41 y.o.   MRN: [id number] [facility] Surgery Progress Note:   1 Day Post-Op  Subjective: Mental status is clear.  Has been walking but still with nausea Objective: Vital signs in last 24 hours: Temp:  [98.2 F (36.8 C)-99.5 F (37.5 C)] 99.5 F (37.5 C) (08/07 1000) Pulse Rate:  [81-106] 94 (08/07 1000) Resp:  [17-18] 17 (08/07 1000) BP: (155-177)/(70-95) 177/95 (08/07 1000) SpO2:  [90 %-100 %] 92 % (08/07 1000) Weight:  [107 kg (235 lb 14.4 oz)] 107 kg (235 lb 14.4 oz) (08/07 0615)  Intake/Output from previous day: 08/06 0701 - 08/07 0700 In: 3163.3 [P.O.:390; I.V.:2773.3] Out: 1925 [Urine:1875; Blood:50] Intake/Output this shift: Total I/O In: 528.3 [P.O.:120; I.V.:408.3] Out: 0   Physical Exam: Work of breathing is normal  Lab Results:  Results for orders placed or performed during the hospital encounter of 11/18/16 (from the past 48 hour(s))  Glucose, capillary     Status: Abnormal   Collection Time: 11/18/16  5:26 AM  Result Value Ref Range   Glucose-Capillary 221 (H) 65 - 99 mg/dL   Comment 1 Notify RN    Comment 2 Document in Chart   Glucose, capillary     Status: Abnormal   Collection Time: 11/18/16  9:30 AM  Result Value Ref Range   Glucose-Capillary 249 (H) 65 - 99 mg/dL  Glucose, capillary     Status: Abnormal   Collection Time: 11/18/16 10:34 AM  Result Value Ref Range   Glucose-Capillary 244 (H) 65 - 99 mg/dL  Glucose, capillary     Status: Abnormal   Collection Time: 11/18/16 12:03 PM  Result Value Ref Range   Glucose-Capillary 200 (H) 65 - 99 mg/dL  Hemoglobin and hematocrit, blood     Status: None   Collection Time: 11/18/16  2:43 PM  Result Value Ref Range   Hemoglobin 14.4 12.0 - 15.0 g/dL   HCT 42.9 36.0 - 46.0 %  CBC     Status: Abnormal   Collection Time: 11/18/16  2:43 PM  Result Value Ref Range   WBC 20.3 (H) 4.0 - 10.5 K/uL   RBC 4.89 3.87 - 5.11 MIL/uL   Hemoglobin 14.3 12.0 -  15.0 g/dL   HCT 42.3 36.0 - 46.0 %   MCV 86.5 78.0 - 100.0 fL   MCH 29.2 26.0 - 34.0 pg   MCHC 33.8 30.0 - 36.0 g/dL   RDW 13.6 11.5 - 15.5 %   Platelets 321 150 - 400 K/uL  Creatinine, serum     Status: None   Collection Time: 11/18/16  2:43 PM  Result Value Ref Range   Creatinine, Ser 0.78 0.44 - 1.00 mg/dL   GFR calc non Af Amer >60 >60 mL/min   GFR calc Af Amer >60 >60 mL/min    Comment: (NOTE) The eGFR has been calculated using the CKD EPI equation. This calculation has not been validated in all clinical situations. eGFR's persistently <60 mL/min signify possible Chronic Kidney Disease.   Glucose, capillary     Status: Abnormal   Collection Time: 11/18/16  4:23 PM  Result Value Ref Range   Glucose-Capillary 216 (H) 65 - 99 mg/dL  Glucose, capillary     Status: Abnormal   Collection Time: 11/18/16  8:36 PM  Result Value Ref Range   Glucose-Capillary 174 (H) 65 - 99 mg/dL  Glucose, capillary     Status: Abnormal   Collection Time: 11/19/16 12:15 AM  Result Value Ref Range   Glucose-Capillary 109 (H) 65 - 99 mg/dL  Glucose, capillary     Status: Abnormal   Collection Time: 11/19/16  4:16 AM  Result Value Ref Range   Glucose-Capillary 119 (H) 65 - 99 mg/dL  CBC WITH DIFFERENTIAL     Status: Abnormal   Collection Time: 11/19/16  5:38 AM  Result Value Ref Range   WBC 13.6 (H) 4.0 - 10.5 K/uL   RBC 4.50 3.87 - 5.11 MIL/uL   Hemoglobin 13.0 12.0 - 15.0 g/dL   HCT 39.2 36.0 - 46.0 %   MCV 87.1 78.0 - 100.0 fL   MCH 28.9 26.0 - 34.0 pg   MCHC 33.2 30.0 - 36.0 g/dL   RDW 13.7 11.5 - 15.5 %   Platelets 270 150 - 400 K/uL   Neutrophils Relative % 81 %   Neutro Abs 10.9 (H) 1.7 - 7.7 K/uL   Lymphocytes Relative 13 %   Lymphs Abs 1.8 0.7 - 4.0 K/uL   Monocytes Relative 6 %   Monocytes Absolute 0.8 0.1 - 1.0 K/uL   Eosinophils Relative 0 %   Eosinophils Absolute 0.0 0.0 - 0.7 K/uL   Basophils Relative 0 %   Basophils Absolute 0.0 0.0 - 0.1 K/uL  Glucose, capillary      Status: Abnormal   Collection Time: 11/19/16  7:32 AM  Result Value Ref Range   Glucose-Capillary 138 (H) 65 - 99 mg/dL  Glucose, capillary     Status: Abnormal   Collection Time: 11/19/16 11:44 AM  Result Value Ref Range   Glucose-Capillary 115 (H) 65 - 99 mg/dL    Radiology/Results: No results found.  Anti-infectives: Anti-infectives    Start     Dose/Rate Route Frequency Ordered Stop   11/18/16 0644  cefoTEtan in Dextrose 5% (CEFOTAN) 2-2.08 GM-% IVPB    Comments:  Chrystine Oiler: cabinet override      11/18/16 0644 11/18/16 0728   11/18/16 0521  cefoTEtan in Dextrose 5% (CEFOTAN) IVPB 2 g     2 g Intravenous On call to O.R. 11/18/16 0522 11/18/16 0758      Assessment/Plan: Problem List: Patient Active Problem List   Diagnosis Date Noted  . S/P gastric bypass 11/18/2016    Slow progress.  Not ready for discharge today 1 Day Post-Op    LOS: 1 day   Matt B. Hassell Done, MD, [facility] [phone] beeper [phone]  11/19/2016 2:17 PM

## 2016-11-20 LAB — CBC WITH DIFFERENTIAL/PLATELET
Basophils Absolute: 0 10*3/uL (ref 0.0–0.1)
Basophils Relative: 0 %
EOS ABS: 0.2 10*3/uL (ref 0.0–0.7)
Eosinophils Relative: 2 %
HEMATOCRIT: 38.7 % (ref 36.0–46.0)
HEMOGLOBIN: 13 g/dL (ref 12.0–15.0)
LYMPHS ABS: 1.9 10*3/uL (ref 0.7–4.0)
LYMPHS PCT: 16 %
MCH: 28.7 pg (ref 26.0–34.0)
MCHC: 33.6 g/dL (ref 30.0–36.0)
MCV: 85.4 fL (ref 78.0–100.0)
MONOS PCT: 5 %
Monocytes Absolute: 0.6 10*3/uL (ref 0.1–1.0)
NEUTROS ABS: 9.2 10*3/uL — AB (ref 1.7–7.7)
NEUTROS PCT: 77 %
Platelets: 246 10*3/uL (ref 150–400)
RBC: 4.53 MIL/uL (ref 3.87–5.11)
RDW: 13.1 % (ref 11.5–15.5)
WBC: 11.9 10*3/uL — ABNORMAL HIGH (ref 4.0–10.5)

## 2016-11-20 LAB — GLUCOSE, CAPILLARY
Glucose-Capillary: 104 mg/dL — ABNORMAL HIGH (ref 65–99)
Glucose-Capillary: 109 mg/dL — ABNORMAL HIGH (ref 65–99)
Glucose-Capillary: 129 mg/dL — ABNORMAL HIGH (ref 65–99)
Glucose-Capillary: 131 mg/dL — ABNORMAL HIGH (ref 65–99)

## 2016-11-20 MED ORDER — INSULIN ASPART 100 UNIT/ML ~~LOC~~ SOLN: 0.0000 [IU] | SUBCUTANEOUS | 11 refills | Status: AC

## 2016-11-20 NOTE — Progress Notes (Signed)
Discharge instructions discussed with patient and family, verbalized agreement and understanding 

## 2016-11-20 NOTE — Progress Notes (Signed)
Spoke with patient endocrinologist office regarding the plan to send patient home with only sliding scale coverage.   Appointment being made by patient to see endocrinologist next week.  Patient is aware to monitor blood sugars 4 times per day and keep an record of blood sugars.  Patient states understanding.

## 2016-11-20 NOTE — Progress Notes (Signed)
Inpatient Diabetes Program Recommendations  AACE/ADA: New Consensus Statement on Inpatient Glycemic Control (2015)  Target Ranges:  Prepandial:   less than 140 mg/dL      Peak postprandial:   less than 180 mg/dL (1-2 hours)      Critically ill patients:  140 - 180 mg/dL   Lab Results  Component Value Date   GLUCAP 129 (H) 11/20/2016   HGBA1C 10.0 (H) 11/13/2016    Review of Glycemic Control  Spoke with pt this am regarding her blood sugars.  Excellent blood sugar control with Novolog 0-20 units Q4H. Discussed hypoglycemia s/s and treatment.  Recommendations for home: Novolog 0-20 units Q4H. Check blood sugars 4-6x/day and call MD for recs. If blood sugars < 180 mg/dL, will likely not need basal insulin.  Will follow while inpatient.   Thank you. Ailene Ardshonda Cedrik Heindl, RD, LDN, CDE Inpatient Diabetes Coordinator [phone]

## 2016-11-20 NOTE — Discharge Summary (Signed)
Physician Discharge Summary  Patient ID: Crystal Palmer MRN: [id number] DOB/AGE: 07/06/1975 41 y.o.  Admit date: 11/18/2016 Discharge date: 11/20/2016  Admission Diagnoses:  Morbid obesity and DM  Discharge Diagnoses:  same  Active Problems:   S/P gastric bypass   Surgery:  Gastric bypass and ventral hernia repair  Discharged Condition: improved  Hospital Course:   Had surgery.  Slow to mobilize and take liquids .  Diet advanced and ready for discharge on PD 2  Consults: none  Significant Diagnostic Studies: none    Discharge Exam: Blood pressure (!) 167/87, pulse 99, temperature 98.8 F (37.1 C), temperature source Oral, resp. rate 18, height 5\' 5"  (1.651 m), weight 107.5 kg (237 lb), SpO2 100 %. Incisions ok  Disposition: Final discharge disposition not confirmed  Discharge Instructions    Ambulate hourly while awake    Complete by:  As directed    Call MD for:  difficulty breathing, headache or visual disturbances    Complete by:  As directed    Call MD for:  persistant dizziness or light-headedness    Complete by:  As directed    Call MD for:  persistant nausea and vomiting    Complete by:  As directed    Call MD for:  redness, tenderness, or signs of infection (pain, swelling, redness, odor or green/yellow discharge around incision site)    Complete by:  As directed    Call MD for:  severe uncontrolled pain    Complete by:  As directed    Call MD for:  temperature >101 F    Complete by:  As directed    Diet bariatric full liquid    Complete by:  As directed    Incentive spirometry    Complete by:  As directed    Perform hourly while awake     Allergies as of 11/20/2016      Reactions   Fish Allergy Itching   Shellfish Allergy Itching   Bee Venom    Tension migraines    Meloxicam & Diet Manage Prod    Made pt feel like having an MI       Medication List    TAKE these medications   aspirin EC 81 MG tablet Take 81 mg by mouth daily.   BASAGLAR  KWIKPEN [place] Inject 80 units of lipase into the skin daily. Patient takes in the pm Notes to patient:  Monitor Blood Sugar Frequently and keep a log for primary care physician, you may need to adjust medication dosage with rapid weight loss.     butalbital-acetaminophen-caffeine 50-325-40-30 MG capsule Commonly known as:  FIORICET WITH CODEINE Take 1 capsule by mouth every 4 (four) hours as needed for headache.   cyclobenzaprine 10 MG tablet Commonly known as:  FLEXERIL Take 10 mg by mouth 3 (three) times daily as needed for muscle spasms.   docusate sodium 100 MG capsule Commonly known as:  COLACE Take 100 mg by mouth 2 (two) times daily.   fluconazole 150 MG tablet Commonly known as:  DIFLUCAN Take 150 mg by mouth daily. As needed for yeast infection   gabapentin 300 MG capsule Commonly known as:  NEURONTIN Take 300 mg by mouth daily.   insulin aspart 100 UNIT/ML injection Commonly known as:  novoLOG Inject 5-10 Units into the skin 3 (three) times daily before meals. Sliding scale- 200-250= 5 units , 250 and above = 10 units What changed:  Another medication with the same name was added. Make sure  you understand how and when to take each.   insulin aspart 100 UNIT/ML injection Commonly known as:  novoLOG Inject 0-20 Units into the skin every 4 (four) hours. What changed:  You were already taking a medication with the same name, and this prescription was added. Make sure you understand how and when to take each.   isosorbide mononitrate 60 MG 24 hr tablet Commonly known as:  IMDUR Take 60 mg by mouth daily. Patient takes in the pm Notes to patient:  Monitor Blood Pressure Daily and keep a log for primary care physician.  You may need to make changes to your medications with rapid weight loss.     JARDIANCE 25 MG Tabs tablet Generic drug:  empagliflozin Take 25 mg by mouth daily. Takes in the pm Notes to patient:  Monitor Blood Sugar Frequently and keep a log for primary care  physician, you may need to adjust medication dosage with rapid weight loss.     LINZESS 290 MCG Caps capsule Generic drug:  linaclotide Take 290 mcg by mouth daily before breakfast. Patient takes in the pm   lisinopril 40 MG tablet Commonly known as:  PRINIVIL,ZESTRIL Take 40 mg by mouth daily. Takes in the pm Notes to patient:  Monitor Blood Pressure Daily and keep a log for primary care physician.  You may need to make changes to your medications with rapid weight loss.     Melatonin 1 MG Tabs Take 1 mg by mouth at bedtime as needed. Patient takes prn   metoprolol tartrate 50 MG tablet Commonly known as:  LOPRESSOR Take 50 mg by mouth 2 (two) times daily. Notes to patient:  Monitor Blood Pressure Daily and keep a log for primary care physician.  You may need to make changes to your medications with rapid weight loss.     nitroGLYCERIN 0.4 MG SL tablet Commonly known as:  NITROSTAT Place 0.4 mg under the tongue every 5 (five) minutes as needed for chest pain.   nystatin powder Generic drug:  nystatin Apply topically 4 (four) times daily.   omeprazole 40 MG capsule Commonly known as:  PRILOSEC Take 40 mg by mouth daily. Patient takes in the pm   ondansetron 4 MG disintegrating tablet Commonly known as:  ZOFRAN-ODT Take 4 mg by mouth every 8 (eight) hours as needed for nausea or vomiting.   pantoprazole 40 MG tablet Commonly known as:  PROTONIX Take 40 mg by mouth daily. Patient takes in the pm   sitaGLIPtin-metformin 50-1000 MG tablet Commonly known as:  JANUMET Take 1 tablet by mouth daily. Patient takes in the p m Notes to patient:  Monitor Blood Sugar Frequently and keep a log for primary care physician, you may need to adjust medication dosage with rapid weight loss.        Follow-up Information    Luretha MurphyMartin, Candido Flott, MD. Go on 12/04/2016.   Specialty:  General Surgery Why:  at 0915 Contact information: [address] [place]  [id number] [phone]        Luretha MurphyMartin, Locklyn Henriquez, MD Follow up.   Specialty:  General Surgery Contact information: [address] [place] [address] [phone]           Signed: Valarie MerinoMARTIN,Dalena Plantz B 11/20/2016, 1:17 PM

## 2016-11-20 NOTE — Plan of Care (Signed)
Problem: Food- and Nutrition-Related Knowledge Deficit (NB-1.1) Goal: Nutrition education Formal process to instruct or train a patient/client in a skill or to impart knowledge to help patients/clients voluntarily manage or modify food choices and eating behavior to maintain or improve health. Outcome: Completed/Met Date Met: 11/20/16 Nutrition Education Note  Received consult for diet education per DROP protocol.   Discussed 2 week post op diet with pt. Emphasized that liquids must be non carbonated, non caffeinated, and sugar free. Fluid goals discussed. Pt to follow up with outpatient bariatric RD for further diet progression after 2 weeks. Multivitamins and minerals also reviewed. Teach back method used, pt expressed understanding, expect good compliance.   Diet: First 2 Weeks  You will see the nutritionist about two (2) weeks after your surgery. The nutritionist will increase the types of foods you can eat if you are handling liquids well:  If you have severe vomiting or nausea and cannot handle clear liquids lasting longer than 1 day, call your surgeon  Protein Shake  Drink at least 2 ounces of shake 5-6 times per day  Each serving of protein shakes (usually 8 - 12 ounces) should have a minimum of:  15 grams of protein  And no more than 5 grams of carbohydrate  Goal for protein each day:  Men = 80 grams per day  Women = 60 grams per day  Protein powder may be added to fluids such as non-fat milk or Lactaid milk or Soy milk (limit to 35 grams added protein powder per serving)   Hydration  Slowly increase the amount of water and other clear liquids as tolerated (See Acceptable Fluids)  Slowly increase the amount of protein shake as tolerated  Sip fluids slowly and throughout the day  May use sugar substitutes in small amounts (no more than 6 - 8 packets per day; i.e. Splenda)   Fluid Goal  The first goal is to drink at least 8 ounces of protein shake/drink per day (or as directed  by the nutritionist); some examples of protein shakes are Premier Protein, [employer], [employer], EAS Edge HP, and Unjury. See handout from pre-op Bariatric Education Class:  Slowly increase the amount of protein shake you drink as tolerated  You may find it easier to slowly sip shakes throughout the day  It is important to get your proteins in first  Your fluid goal is to drink 64 - 100 ounces of fluid daily  It may take a few weeks to build up to this  32 oz (or more) should be clear liquids  And  32 oz (or more) should be full liquids (see below for examples)  Liquids should not contain sugar, caffeine, or carbonation   Clear Liquids:  Water or Sugar-free flavored water (i.e. Fruit H2O, Propel)  Decaffeinated coffee or tea (sugar-free)  Crystal Lite, Wyler's Lite, Minute Maid Lite  Sugar-free Jell-O  Bouillon or broth  Sugar-free Popsicle: *Less than 20 calories each; Limit 1 per day   Full Liquids:  Protein Shakes/Drinks + 2 choices per day of other full liquids  Full liquids must be:  No More Than 12 grams of Carbs per serving  No More Than 3 grams of Fat per serving  Strained low-fat cream soup  Non-Fat milk  Fat-free Lactaid Milk  Sugar-free yogurt (Dannon Lite & Fit, [place] yogurt, Oikos Zero)   Clayton Bibles, MS, RD, LDN Pager: [phone] After Hours Pager: [phone]

## 2016-11-20 NOTE — Progress Notes (Signed)
Patient alert and oriented, pain is controlled. Patient is tolerating fluids, advanced to protein shake today, patient is tolerating well. Reviewed Gastric Bypass discharge instructions with patient and patient is able to articulate understanding. Provided information on BELT program, Support Group and [facility] outpatient pharmacy. All questions answered, will continue to monitor.    

## 2016-11-20 NOTE — Progress Notes (Signed)
Patient ID: Crystal Palmer, female   DOB: Mar 04, 1976, 41 y.o.   MRN: [id number] [facility] Surgery Progress Note:   2 Days Post-Op  Subjective: Mental status is more alert. Objective: Vital signs in last 24 hours: Temp:  [98.5 F (36.9 C)-100.4 F (38 C)] 99.1 F (37.3 C) (08/08 0409) Pulse Rate:  [80-105] 105 (08/08 0409) Resp:  [17-19] 18 (08/08 0409) BP: (140-178)/(66-98) 178/98 (08/08 0409) SpO2:  [92 %-99 %] 96 % (08/08 0409) Weight:  [107.5 kg (237 lb)] 107.5 kg (237 lb) (08/08 0530)  Intake/Output from previous day: 08/07 0701 - 08/08 0700 In: 3218.3 [P.O.:810; I.V.:2408.3] Out: 2600 [Urine:2600] Intake/Output this shift: No intake/output data recorded.  Physical Exam: Work of breathing is normal.   Lab Results:  Results for orders placed or performed during the hospital encounter of 11/18/16 (from the past 48 hour(s))  Glucose, capillary     Status: Abnormal   Collection Time: 11/18/16  9:30 AM  Result Value Ref Range   Glucose-Capillary 249 (H) 65 - 99 mg/dL  Glucose, capillary     Status: Abnormal   Collection Time: 11/18/16 10:34 AM  Result Value Ref Range   Glucose-Capillary 244 (H) 65 - 99 mg/dL  Glucose, capillary     Status: Abnormal   Collection Time: 11/18/16 12:03 PM  Result Value Ref Range   Glucose-Capillary 200 (H) 65 - 99 mg/dL  Hemoglobin and hematocrit, blood     Status: None   Collection Time: 11/18/16  2:43 PM  Result Value Ref Range   Hemoglobin 14.4 12.0 - 15.0 g/dL   HCT 42.9 36.0 - 46.0 %  CBC     Status: Abnormal   Collection Time: 11/18/16  2:43 PM  Result Value Ref Range   WBC 20.3 (H) 4.0 - 10.5 K/uL   RBC 4.89 3.87 - 5.11 MIL/uL   Hemoglobin 14.3 12.0 - 15.0 g/dL   HCT 42.3 36.0 - 46.0 %   MCV 86.5 78.0 - 100.0 fL   MCH 29.2 26.0 - 34.0 pg   MCHC 33.8 30.0 - 36.0 g/dL   RDW 13.6 11.5 - 15.5 %   Platelets 321 150 - 400 K/uL  Creatinine, serum     Status: None   Collection Time: 11/18/16  2:43 PM  Result Value Ref Range    Creatinine, Ser 0.78 0.44 - 1.00 mg/dL   GFR calc non Af Amer >60 >60 mL/min   GFR calc Af Amer >60 >60 mL/min    Comment: (NOTE) The eGFR has been calculated using the CKD EPI equation. This calculation has not been validated in all clinical situations. eGFR's persistently <60 mL/min signify possible Chronic Kidney Disease.   Glucose, capillary     Status: Abnormal   Collection Time: 11/18/16  4:23 PM  Result Value Ref Range   Glucose-Capillary 216 (H) 65 - 99 mg/dL  Glucose, capillary     Status: Abnormal   Collection Time: 11/18/16  8:36 PM  Result Value Ref Range   Glucose-Capillary 174 (H) 65 - 99 mg/dL  Glucose, capillary     Status: Abnormal   Collection Time: 11/19/16 12:15 AM  Result Value Ref Range   Glucose-Capillary 109 (H) 65 - 99 mg/dL  Glucose, capillary     Status: Abnormal   Collection Time: 11/19/16  4:16 AM  Result Value Ref Range   Glucose-Capillary 119 (H) 65 - 99 mg/dL  CBC WITH DIFFERENTIAL     Status: Abnormal   Collection Time: 11/19/16  5:38 AM  Result Value Ref Range   WBC 13.6 (H) 4.0 - 10.5 K/uL   RBC 4.50 3.87 - 5.11 MIL/uL   Hemoglobin 13.0 12.0 - 15.0 g/dL   HCT 39.2 36.0 - 46.0 %   MCV 87.1 78.0 - 100.0 fL   MCH 28.9 26.0 - 34.0 pg   MCHC 33.2 30.0 - 36.0 g/dL   RDW 13.7 11.5 - 15.5 %   Platelets 270 150 - 400 K/uL   Neutrophils Relative % 81 %   Neutro Abs 10.9 (H) 1.7 - 7.7 K/uL   Lymphocytes Relative 13 %   Lymphs Abs 1.8 0.7 - 4.0 K/uL   Monocytes Relative 6 %   Monocytes Absolute 0.8 0.1 - 1.0 K/uL   Eosinophils Relative 0 %   Eosinophils Absolute 0.0 0.0 - 0.7 K/uL   Basophils Relative 0 %   Basophils Absolute 0.0 0.0 - 0.1 K/uL  Glucose, capillary     Status: Abnormal   Collection Time: 11/19/16  7:32 AM  Result Value Ref Range   Glucose-Capillary 138 (H) 65 - 99 mg/dL  Glucose, capillary     Status: Abnormal   Collection Time: 11/19/16 11:44 AM  Result Value Ref Range   Glucose-Capillary 115 (H) 65 - 99 mg/dL  Glucose,  capillary     Status: Abnormal   Collection Time: 11/19/16  4:19 PM  Result Value Ref Range   Glucose-Capillary 112 (H) 65 - 99 mg/dL  Glucose, capillary     Status: Abnormal   Collection Time: 11/19/16  8:14 PM  Result Value Ref Range   Glucose-Capillary 131 (H) 65 - 99 mg/dL  Glucose, capillary     Status: Abnormal   Collection Time: 11/20/16 12:01 AM  Result Value Ref Range   Glucose-Capillary 109 (H) 65 - 99 mg/dL  Glucose, capillary     Status: Abnormal   Collection Time: 11/20/16  4:02 AM  Result Value Ref Range   Glucose-Capillary 104 (H) 65 - 99 mg/dL  CBC with Differential     Status: Abnormal   Collection Time: 11/20/16  5:41 AM  Result Value Ref Range   WBC 11.9 (H) 4.0 - 10.5 K/uL   RBC 4.53 3.87 - 5.11 MIL/uL   Hemoglobin 13.0 12.0 - 15.0 g/dL   HCT 38.7 36.0 - 46.0 %   MCV 85.4 78.0 - 100.0 fL   MCH 28.7 26.0 - 34.0 pg   MCHC 33.6 30.0 - 36.0 g/dL   RDW 13.1 11.5 - 15.5 %   Platelets 246 150 - 400 K/uL   Neutrophils Relative % 77 %   Neutro Abs 9.2 (H) 1.7 - 7.7 K/uL   Lymphocytes Relative 16 %   Lymphs Abs 1.9 0.7 - 4.0 K/uL   Monocytes Relative 5 %   Monocytes Absolute 0.6 0.1 - 1.0 K/uL   Eosinophils Relative 2 %   Eosinophils Absolute 0.2 0.0 - 0.7 K/uL   Basophils Relative 0 %   Basophils Absolute 0.0 0.0 - 0.1 K/uL  Glucose, capillary     Status: Abnormal   Collection Time: 11/20/16  7:39 AM  Result Value Ref Range   Glucose-Capillary 129 (H) 65 - 99 mg/dL    Radiology/Results: No results found.  Anti-infectives: Anti-infectives    Start     Dose/Rate Route Frequency Ordered Stop   11/18/16 0644  cefoTEtan in Dextrose 5% (CEFOTAN) 2-2.08 GM-% IVPB    Comments:  Chrystine Oiler: cabinet override      11/18/16 2637 11/18/16 0728   11/18/16  0347  cefoTEtan in Dextrose 5% (CEFOTAN) IVPB 2 g     2 g Intravenous On call to O.R. 11/18/16 0522 11/18/16 0758      Assessment/Plan: Problem List: Patient Active Problem List   Diagnosis Date  Noted  . S/P gastric bypass 11/18/2016    Improved.  Hopeful discharge later today.   2 Days Post-Op    LOS: 2 days   Matt B. Hassell Done, MD, [facility] [phone] beeper [phone]  11/20/2016 8:30 AM

## 2016-11-23 ENCOUNTER — Telehealth: Payer: Self-pay | Admitting: General Surgery

## 2016-11-23 NOTE — Telephone Encounter (Signed)
She called this morning.  She status post laparoscopic gastric bypass by Dr. Daphine DeutscherMartin 5 days ago.  She reports she has been having some consistent light pink drainage coming from 1 of her incisions this morning.  She has tried some direct pressure but continues to slowly drain.  It sounds like it is a seroma to me.  I advised her to place an ice pack off some direct pressure.  I told her it would likely slow down with time.  I told her if she soaks through 3 or 4 pads and it was still draining, she should go to the emergency room.  She seems to understand all this.

## 2016-11-23 NOTE — Telephone Encounter (Signed)
She called again and reported that she was now having some yellowish drainage from the wound and it was painful. I recommended she come to the emergency department for evaluation.

## 2016-11-24 ENCOUNTER — Emergency Department (HOSPITAL_COMMUNITY)
Admission: EM | Admit: 2016-11-24 | Discharge: 2016-11-24 | Disposition: A | Discharge status: Home / Self Care | Payer: BC Managed Care – PPO | Attending: Emergency Medicine | Admitting: Emergency Medicine

## 2016-11-24 ENCOUNTER — Emergency Department (HOSPITAL_COMMUNITY): Payer: BC Managed Care – PPO

## 2016-11-24 ENCOUNTER — Encounter (HOSPITAL_COMMUNITY): Payer: Self-pay

## 2016-11-24 DIAGNOSIS — Z794 Long term (current) use of insulin: Secondary | ICD-10-CM | POA: Diagnosis not present

## 2016-11-24 DIAGNOSIS — R1084 Generalized abdominal pain: Secondary | ICD-10-CM | POA: Diagnosis not present

## 2016-11-24 DIAGNOSIS — Z79899 Other long term (current) drug therapy: Secondary | ICD-10-CM | POA: Insufficient documentation

## 2016-11-24 DIAGNOSIS — J45909 Unspecified asthma, uncomplicated: Secondary | ICD-10-CM | POA: Diagnosis not present

## 2016-11-24 DIAGNOSIS — Z7982 Long term (current) use of aspirin: Secondary | ICD-10-CM | POA: Insufficient documentation

## 2016-11-24 DIAGNOSIS — E119 Type 2 diabetes mellitus without complications: Secondary | ICD-10-CM | POA: Insufficient documentation

## 2016-11-24 DIAGNOSIS — L7634 Postprocedural seroma of skin and subcutaneous tissue following other procedure: Secondary | ICD-10-CM | POA: Diagnosis not present

## 2016-11-24 DIAGNOSIS — I10 Essential (primary) hypertension: Secondary | ICD-10-CM | POA: Diagnosis not present

## 2016-11-24 LAB — CBC WITH DIFFERENTIAL/PLATELET
Basophils Absolute: 0 10*3/uL (ref 0.0–0.1)
Basophils Relative: 0 %
EOS PCT: 3 %
Eosinophils Absolute: 0.4 10*3/uL (ref 0.0–0.7)
HEMATOCRIT: 40.5 % (ref 36.0–46.0)
HEMOGLOBIN: 13.8 g/dL (ref 12.0–15.0)
LYMPHS ABS: 2.4 10*3/uL (ref 0.7–4.0)
LYMPHS PCT: 21 %
MCH: 29.2 pg (ref 26.0–34.0)
MCHC: 34.1 g/dL (ref 30.0–36.0)
MCV: 85.6 fL (ref 78.0–100.0)
Monocytes Absolute: 0.5 10*3/uL (ref 0.1–1.0)
Monocytes Relative: 5 %
NEUTROS ABS: 7.7 10*3/uL (ref 1.7–7.7)
Neutrophils Relative %: 71 %
PLATELETS: 328 10*3/uL (ref 150–400)
RBC: 4.73 MIL/uL (ref 3.87–5.11)
RDW: 13.3 % (ref 11.5–15.5)
WBC: 11 10*3/uL — AB (ref 4.0–10.5)

## 2016-11-24 LAB — I-STAT TROPONIN, ED: Troponin i, poc: 0.01 ng/mL (ref 0.00–0.08)

## 2016-11-24 LAB — COMPREHENSIVE METABOLIC PANEL
ALBUMIN: 4 g/dL (ref 3.5–5.0)
ALT: 22 U/L (ref 14–54)
AST: 15 U/L (ref 15–41)
Alkaline Phosphatase: 58 U/L (ref 38–126)
Anion gap: 8 (ref 5–15)
BUN: 7 mg/dL (ref 6–20)
CHLORIDE: 103 mmol/L (ref 101–111)
CO2: 29 mmol/L (ref 22–32)
Calcium: 9.2 mg/dL (ref 8.9–10.3)
Creatinine, Ser: 0.61 mg/dL (ref 0.44–1.00)
GFR calc Af Amer: >60 mL/min (ref >60)
Glucose, Bld: 154 mg/dL — ABNORMAL HIGH (ref 65–99)
POTASSIUM: 3.5 mmol/L (ref 3.5–5.1)
Sodium: 140 mmol/L (ref 135–145)
Total Bilirubin: 0.4 mg/dL (ref 0.3–1.2)
Total Protein: 8.1 g/dL (ref 6.5–8.1)

## 2016-11-24 LAB — LIPASE, BLOOD: Lipase: 23 U/L (ref 11–51)

## 2016-11-24 LAB — I-STAT CG4 LACTIC ACID, ED: LACTIC ACID, VENOUS: 1.33 mmol/L (ref 0.5–1.9)

## 2016-11-24 MED ORDER — SODIUM CHLORIDE 0.9 % IV BOLUS (SEPSIS)
500.0000 mL | Freq: Once | INTRAVENOUS | Status: AC
  Administered 2016-11-24: 500 mL via INTRAVENOUS

## 2016-11-24 MED ORDER — OXYCODONE-ACETAMINOPHEN 5-325 MG PO TABS: 1.0000 | ORAL_TABLET | Freq: Once | ORAL | Status: DC

## 2016-11-24 MED ORDER — MORPHINE SULFATE (PF) 2 MG/ML IV SOLN
4.0000 mg | Freq: Once | INTRAVENOUS | Status: AC
  Administered 2016-11-24: 4 mg via INTRAVENOUS
  Filled 2016-11-24: qty 2

## 2016-11-24 MED ORDER — IOPAMIDOL (ISOVUE-300) INJECTION 61%
INTRAVENOUS | Status: AC
Start: 2016-11-24 — End: 2016-11-24
  Administered 2016-11-24: 100 mL via INTRAVENOUS
  Filled 2016-11-24: qty 100

## 2016-11-24 MED ORDER — IOPAMIDOL (ISOVUE-300) INJECTION 61%
INTRAVENOUS | Status: AC
  Administered 2016-11-24: 30 mL via ORAL
  Filled 2016-11-24: qty 30

## 2016-11-24 NOTE — ED Notes (Signed)
surgilube applied to incision sites

## 2016-11-24 NOTE — ED Triage Notes (Signed)
Pt states that she had bariatric surgery on Monday and has been out of pain medication since Friday. Yesterday, she noticed yellow drainage from one of her incision and she states that it is painful to eat/ drink and breathe. Also endorses chest pressure. Denies vomiting. A&Ox4.

## 2016-11-24 NOTE — ED Notes (Signed)
General surgeon in to see pt  

## 2016-11-24 NOTE — Progress Notes (Signed)
General Surgery [facility]  Patient seen and examined in the ER.  Discussed with ER staff.  CT shows seroma beneath umbilical wound.  Opened at bedside with Qtip.  Serosanguinous drainage.  Iodoform wick placed and covered with dry gauze dressing.  Given Rx for liquid oxycodone 5mg /455ml dispense #[id number] at patient request.  Will arrange follow up at [facility] on Tuesday for wound check and removal of gauze wick.  Velora Hecklerodd M. Alauna Hayden, MD, [facility] Office: [phone]

## 2016-11-24 NOTE — ED Notes (Signed)
Returned from CT.

## 2016-11-24 NOTE — Discharge Instructions (Signed)
You have been given a prescription for pain medication by Dr. Gerrit FriendsGerkin, who evaluated you in the ED today. Continue to change bandages daily. Follow-up with Dr. Daphine DeutscherMartin in the clinic this week for reevaluation and wound check. Return to the ED immediately if any concerning signs or symptoms develop such as fever, productive cough, or worsening symptoms.

## 2016-11-24 NOTE — ED Provider Notes (Signed)
[facility]-EMERGENCY DEPT Provider Note   CSN: [id number] Arrival date & time: 11/24/16  1723     History   Chief Complaint Chief Complaint  Patient presents with  . Post-op Problem    HPI Crystal Palmer is a 41 y.o. female with PMHX DM, GERD, migraine headaches, nephrolithiasis, HTN, and morbid obesity who presents today with chief complaint acute onset, gradually worsening abdominal pain for several days. She states that she underwent gastric bypass surgery 6 days ago with Dr. Daphine Deutscher and ran out of her pain medications 2 days ago. She endorses constant generalized abdominal pain which is sharp and throbbing in nature and radiates to her flank and back. Also endorses the development of left-sided chest pressure earlier today. Endorses subjective fevers and chills. Denies shortness of breath, nausea, vomiting, diarrhea. She states she has not had a bowel movement since the surgery 6 days ago. She states that one of her incision sites has been draining serosanguineous fluid, began draining yellow purulent material yesterday. She was advised to present to the ED for further evaluation.   The history is provided by the patient.    Past Medical History:  Diagnosis Date  . Anemia   . Arthritis   . Asthma   . Diabetes mellitus without complication (HCC)    type II  . GERD (gastroesophageal reflux disease)   . Headache    migraines   . History of kidney stones   . Hypertension     Patient Active Problem List   Diagnosis Date Noted  . 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 ABDOMINAL HYSTERECTOMY    . bilateral foot surgery     . CARDIAC CATHETERIZATION     x 2  . CESAREAN SECTION     x 4  . CHOLECYSTECTOMY    . GASTRIC ROUX-EN-Y N/A 11/18/2016   Procedure: LAPAROSCOPIC ROUX-EN-Y GASTRIC BYPASS WITH UPPER ENDOSCOPY;  Surgeon: Luretha Murphy, MD;  Location: [facility];  Service: General;  Laterality: N/A;  . HERNIA REPAIR     umbilical  hernia   . right knee surgery      . SINUS SURGERY WITH INSTATRAK      OB History    No data available       Home Medications    Prior to Admission medications   Medication Sig Start Date End Date Taking? Authorizing Provider  aspirin EC 81 MG tablet Take 81 mg by mouth daily.   Yes [provider]  butalbital-acetaminophen-caffeine (FIORICET WITH CODEINE) 50-325-40-30 MG capsule Take 1 capsule by mouth every 4 (four) hours as needed for headache.   Yes [provider]  CALCIUM PO Take 1 tablet by mouth 3 (three) times daily.   Yes [provider]  cyclobenzaprine (FLEXERIL) 10 MG tablet Take 10 mg by mouth 3 (three) times daily as needed for muscle spasms.   Yes [provider]  docusate sodium (COLACE) 100 MG capsule Take 100 mg by mouth 2 (two) times daily.   Yes [provider]  empagliflozin (JARDIANCE) 25 MG TABS tablet Take 25 mg by mouth daily. Takes in the pm   Yes [provider]  fluconazole (DIFLUCAN) 150 MG tablet Take 150 mg by mouth daily. As needed for yeast infection   Yes [provider]  gabapentin (NEURONTIN) 300 MG capsule Take 300 mg by mouth daily.   Yes [provider]  insulin aspart (NOVOLOG) 100 UNIT/ML injection Inject 0-20 Units  into the skin every 4 (four) hours. 11/20/16  Yes Luretha Murphy, MD  Insulin Glargine [facility] [place]) Inject 80 units of lipase into the skin daily. Patient takes in the pm   Yes [provider]  isosorbide mononitrate (IMDUR) 60 MG 24 hr tablet Take 60 mg by mouth daily. Patient takes in the pm   Yes [provider]  linaclotide (LINZESS) 290 MCG CAPS capsule Take 290 mcg by mouth daily before breakfast. Patient takes in the pm   Yes [provider]  lisinopril (PRINIVIL,ZESTRIL) 40 MG tablet Take 40 mg by mouth daily. Takes in the pm   Yes [provider]  Melatonin 1 MG TABS Take 1 mg by mouth at bedtime as needed.  Patient takes prn    Yes [provider]  metoprolol (LOPRESSOR) 50 MG tablet Take 50 mg by mouth 2 (two) times daily.   Yes [provider]  Multiple Vitamin (MULTIVITAMIN) tablet Take 1 tablet by mouth 2 (two) times daily.   Yes [provider]  nitroGLYCERIN (NITROSTAT) 0.4 MG SL tablet Place 0.4 mg under the tongue every 5 (five) minutes as needed for chest pain.   Yes [provider]  nystatin (NYSTATIN) powder Apply 1 Bottle topically daily as needed (antifungal).    Yes [provider]  omeprazole (PRILOSEC) 40 MG capsule Take 40 mg by mouth daily. Patient takes in the pm   Yes [provider]  ondansetron (ZOFRAN-ODT) 4 MG disintegrating tablet Take 4 mg by mouth every 8 (eight) hours as needed for nausea or vomiting.   Yes [provider]  pantoprazole (PROTONIX) 40 MG tablet Take 40 mg by mouth daily. Patient takes in the pm   Yes [provider]  sitaGLIPtin-metformin (JANUMET) 50-1000 MG tablet Take 1 tablet by mouth daily. Patient takes in the p m   Yes [provider]    Family History Family History  Problem Relation Age of Onset  . Cancer Other   . Hypertension Other   . Diabetes Other   . Heart disease Other     Social History Social History  Substance Use Topics  . Smoking status: Never Smoker  . Smokeless tobacco: Never Used  . Alcohol use No     Allergies   Fish allergy; Shellfish allergy; Bee venom; and Meloxicam & diet manage prod   Review of Systems Review of Systems  Constitutional: Positive for chills and fever.  Respiratory: Negative for cough and shortness of breath.   Cardiovascular: Positive for chest pain.  Gastrointestinal: Positive for abdominal pain and constipation. Negative for diarrhea, nausea and vomiting.  Genitourinary: Negative for dysuria and hematuria.  Musculoskeletal: Positive for back pain.  Neurological: Negative for syncope and weakness.  All other  systems reviewed and are negative.    Physical Exam Updated Vital Signs BP (!) 169/96 (BP Location: Right Arm)   Pulse 86   Temp 99.2 F (37.3 C) (Oral)   Resp 20   SpO2 98%   Physical Exam  Constitutional: She is oriented to person, place, and time. She appears well-developed and well-nourished. No distress.  HENT:  Head: Normocephalic and atraumatic.  Eyes: Conjunctivae are normal. Right eye exhibits no discharge. Left eye exhibits no discharge.  Neck: No JVD present. No tracheal deviation present.  Cardiovascular: Normal rate, regular rhythm and normal heart sounds.   Pulmonary/Chest: Effort normal and breath sounds normal. She exhibits no tenderness.  Abdominal: Soft. Bowel sounds are normal. She exhibits distension. There  is tenderness.  Diffuse tenderness to palpation of the abdomen with no focal tenderness. Five 2 cm surgical incisions to the abdomen. The incision inferior to the umbilicus appears to be draining yellow fluid under Dermabond. No surrounding erythema or fluctuance or induration.   Musculoskeletal: Normal range of motion. She exhibits no edema or tenderness.  No midline spine TTP, no paraspinal muscle tenderness, no deformity, crepitus, or step-off noted   Neurological: She is alert and oriented to person, place, and time.  Skin: Skin is warm and dry. No erythema.  Psychiatric: She has a normal mood and affect. Her behavior is normal.  Nursing note and vitals reviewed.    ED Treatments / Results  Labs (all labs ordered are listed, but only abnormal results are displayed) Labs Reviewed  COMPREHENSIVE METABOLIC PANEL - Abnormal; Notable for the following:       Result Value   Glucose, Bld 154 (*)    All other components within normal limits  CBC WITH DIFFERENTIAL/PLATELET - Abnormal; Notable for the following:    WBC 11.0 (*)    All other components within normal limits  LIPASE, BLOOD  URINALYSIS, ROUTINE W REFLEX MICROSCOPIC  I-STAT CG4 LACTIC ACID,  ED  I-STAT TROPONIN, ED    EKG  EKG Interpretation None       Radiology Dg Chest 2 View  Result Date: 11/24/2016 CLINICAL DATA:  41 year old female with history of asthma. Central chest pain. Shortness of breath. EXAM: CHEST  2 VIEW COMPARISON:  Chest x-ray 08/01/2016. FINDINGS: Lung volumes are normal. No consolidative airspace disease. No pleural effusions. No pneumothorax. No pulmonary nodule or mass noted. Pulmonary vasculature and the cardiomediastinal silhouette are within normal limits. IMPRESSION: No radiographic evidence of acute cardiopulmonary disease. Electronically Signed   By: Trudie Reedaniel  Entrikin M.D.   On: 11/24/2016 19:58   Ct Abdomen Pelvis W Contrast  Result Date: 11/24/2016 CLINICAL DATA:  Acute onset of generalized chest pressure and abdominal pain. Drainage from bariatric surgery incision site. Initial encounter. EXAM: CT ABDOMEN AND PELVIS WITH CONTRAST TECHNIQUE: Multidetector CT imaging of the abdomen and pelvis was performed using the standard protocol following bolus administration of intravenous contrast. CONTRAST:  100 mL ISOVUE-300 IOPAMIDOL (ISOVUE-300) INJECTION 61% COMPARISON:  None. FINDINGS: Lower chest: The visualized lung bases are clear. The visualized portions of the mediastinum are unremarkable. Hepatobiliary: The liver is unremarkable in appearance. The patient is status post cholecystectomy, with clips noted at the gallbladder fossa. The common bile duct remains normal in caliber. Pancreas: The pancreas is within normal limits. Spleen: The spleen is unremarkable in appearance. Adrenals/Urinary Tract: The adrenal glands are unremarkable in appearance. The kidneys are within normal limits. There is no evidence of hydronephrosis. No renal or ureteral stones are identified. No perinephric stranding is seen. Stomach/Bowel: The gastrojejunal anastomosis is grossly unremarkable in appearance, with minimal postoperative inflammation. The distal stomach contains a small  amount of fluid and is unremarkable in appearance. The small bowel is within normal limits. The appendix is not visualized; there is no evidence for appendicitis. The colon is unremarkable in appearance. Vascular/Lymphatic: The abdominal aorta is unremarkable in appearance. The inferior vena cava is grossly unremarkable. No retroperitoneal lymphadenopathy is seen. No pelvic sidewall lymphadenopathy is identified. Reproductive: The bladder is mildly distended and grossly unremarkable. The patient is status post hysterectomy. No suspicious adnexal masses are seen. The ovaries appear grossly symmetric. Other: Soft tissue edema is seen tracking along the anterior abdominal wall, with a collection of fluid and trace air noted  just inferior to the umbilicus, measuring 4.4 x 4.1 x 2.1 cm. This may reflect a postoperative seroma, though evolving abscess cannot be entirely excluded. Surrounding soft tissue inflammation is noted. Minimal air is noted tracking about the rectus abdominis muscle more superiorly. Skin thickening is noted along the lower anterior abdominal wall. Skin thickening is also noted along the upper back. Musculoskeletal: No acute osseous abnormalities are identified. The visualized musculature is unremarkable in appearance. IMPRESSION: 1. Collection of fluid and trace air noted just inferior to the umbilicus, measuring 4.4 x 4.1 x 2.1 cm. This may reflect a postoperative seroma, though evolving abscess cannot be entirely excluded. Surrounding soft tissue inflammation. Soft tissue edema noted tracking along the anterior abdominal wall. Minimal air noted tracking about the rectus abdominis muscle more superiorly. 2. Skin thickening along the lower anterior abdominal wall. Skin thickening along the upper back. Electronically Signed   By: Roanna Raider M.D.   On: 11/24/2016 21:31    Procedures Procedures (including critical care time)  Medications Ordered in ED Medications  morphine 2 MG/ML injection  4 mg (4 mg Intravenous Given 11/24/16 1915)  sodium chloride 0.9 % bolus 500 mL (0 mLs Intravenous Stopped 11/24/16 2000)  iopamidol (ISOVUE-300) 61 % injection (100 mLs Intravenous Contrast Given 11/24/16 2043)  iopamidol (ISOVUE-300) 61 % injection (30 mLs Oral Contrast Given 11/24/16 2043)  morphine 2 MG/ML injection 4 mg (4 mg Intravenous Given 11/24/16 2127)     Initial Impression / Assessment and Plan / ED Course  I have reviewed the triage vital signs and the nursing notes.  Pertinent labs & imaging results that were available during my care of the patient were reviewed by me and considered in my medical decision making (see chart for details).     Patient presents 6 days status post gastric bypass surgery with complaint of persisting abdominal pain with radiation to the chest and back as well as abnormal drainage from one of her incision sites. Hypertensive while in the ED but has not been able to tolerate her hypertension medications while at home. Otherwise she is afebrile and her vital signs are stable. Chest x-ray shows no acute evidence of cardiopulmonary abnormality and she has no fever or cough to suggest post-op pneumonia. Mild nonspecific leukocytosis of 11. No electrolyte abnormalities and otherwise lab work is reassuring. Troponin is negative and I doubt ACS/MI contributing to symptoms. Spoke with Dr. Gerrit Friends with general surgery, who recommended removal of dermabond and application of warm compresses. Ct abd and pelvis shows collection of fluid and trace air noted just inferior to the umbilicus, measuring 4.4 x 4.1 x 2.1 cm suggesting postoperative seroma, though cannot exclude abscess, as well as surrounding soft tissue inflammation. Dr. Gerrit Friends probed and extensively washed out this area while in the ED with findings suggestive of seroma but not abscess. Iodoform gauze was packed and pt was given wound care instructions. She will follow-up in Dr. Ermalene Searing office this week for wound  check and reevaluation. Dr. Gerrit Friends refilled liquid pain medication for patient. She is stable for discharge home at this time. Discussed indications for return to the ED. Pt verbalized understanding of and agreement with plan and is safe for discharge home at this time.  Final Clinical Impressions(s) / ED Diagnoses   Final diagnoses:  Postoperative seroma of skin after non-dermatologic procedure  Generalized abdominal pain    New Prescriptions Discharge Medication List as of 11/24/2016  9:58 PM       Luevenia Maxin, Marlynn Perking, PA-C  11/24/16 2231    Lorre Nick, MD 11/25/16 [phone]

## 2016-11-24 NOTE — ED Notes (Signed)
Patient transported to CT 

## 2016-11-26 ENCOUNTER — Telehealth (HOSPITAL_COMMUNITY): Payer: Self-pay

## 2016-11-28 NOTE — Telephone Encounter (Signed)
Made discharge phone call to patient. Asking the following questions.    1. Do you have someone to care for you now that you are home?  independent 2. Are you having pain now that is not relieved by your pain medication? Not needed 3. Are you able to drink the recommended daily amount of fluids (48 ounces minimum/day) and protein (60-80 grams/day) as 30 grms of protein and 30 ounces fluid, encouraged to increase fluids  prescribed by the dietitian or nutritional counselor?   4. Are you taking the vitamins and minerals as prescribed?  yes 5. Do you have the "on call" number to contact your surgeon if you have a problem or question?  yes 6. Are your incisions free of redness, swelling or drainage? (If steri strips, address that these can fall off, shower as tolerated) umbilicus draining had to go to ER 7. Have your bowels moved since your surgery?  If not, are you passing gas?  yes 8. Are you up and walking 3-4 times per day?  yes 9. Were you provided your discharge medications before your surgery or before you were discharged from the hospital and are you taking them without problem?  yes

## 2016-12-03 ENCOUNTER — Encounter: Payer: BC Managed Care – PPO | Attending: Surgery

## 2016-12-03 DIAGNOSIS — K219 Gastro-esophageal reflux disease without esophagitis: Secondary | ICD-10-CM | POA: Diagnosis not present

## 2016-12-03 DIAGNOSIS — Z01818 Encounter for other preprocedural examination: Secondary | ICD-10-CM | POA: Insufficient documentation

## 2016-12-03 DIAGNOSIS — E669 Obesity, unspecified: Secondary | ICD-10-CM

## 2016-12-03 DIAGNOSIS — I10 Essential (primary) hypertension: Secondary | ICD-10-CM | POA: Insufficient documentation

## 2016-12-03 DIAGNOSIS — Z713 Dietary counseling and surveillance: Secondary | ICD-10-CM | POA: Insufficient documentation

## 2016-12-03 DIAGNOSIS — Z6841 Body Mass Index (BMI) 40.0 and over, adult: Secondary | ICD-10-CM | POA: Insufficient documentation

## 2016-12-03 DIAGNOSIS — E119 Type 2 diabetes mellitus without complications: Secondary | ICD-10-CM | POA: Diagnosis not present

## 2016-12-10 NOTE — Progress Notes (Addendum)
Bariatric Class:  Appt start time: 1530 end time:  1630.  2 Week Post-Operative Nutrition Class  Patient was seen on 12/03/2016 for Post-Operative Nutrition education at [facility].   Pt states she checks every 2 hours: this was clarified: 160-170  Surgery date: 11/18/2016 Surgery type: RYGB Start weight at [facility]: 248.5 Weight today: 219.8  TANITA  BODY COMP RESULTS  12/03/2016   BMI (kg/m^2) 36.6   Fat Mass (lbs) 109.4   Fat Free Mass (lbs) 110.4   Total Body Water (lbs) 80.2   The following the learning objectives were met by the patient during this course:  Identifies Phase 3A (Soft, High Proteins) Dietary Goals and will begin from 2 weeks post-operatively to 2 months post-operatively  Identifies appropriate sources of fluids and proteins   States protein recommendations and appropriate sources post-operatively  Identifies the need for appropriate texture modifications, mastication, and bite sizes when consuming solids  Identifies appropriate multivitamin and calcium sources post-operatively  Describes the need for physical activity post-operatively and will follow MD recommendations  States when to call healthcare provider regarding medication questions or post-operative complications  Handouts given during class include:  Phase 3A: Soft, High Protein Diet Handout  Follow-Up Plan: Patient will follow-up at [facility] in 6 weeks for 2 month post-op nutrition visit for diet advancement per MD.

## 2016-12-19 ENCOUNTER — Encounter: Payer: Self-pay | Admitting: Registered"

## 2016-12-19 ENCOUNTER — Encounter: Payer: BC Managed Care – PPO | Attending: Surgery | Admitting: Registered"

## 2016-12-19 DIAGNOSIS — I10 Essential (primary) hypertension: Secondary | ICD-10-CM | POA: Diagnosis not present

## 2016-12-19 DIAGNOSIS — E119 Type 2 diabetes mellitus without complications: Secondary | ICD-10-CM | POA: Diagnosis not present

## 2016-12-19 DIAGNOSIS — Z713 Dietary counseling and surveillance: Secondary | ICD-10-CM | POA: Diagnosis not present

## 2016-12-19 DIAGNOSIS — Z6841 Body Mass Index (BMI) 40.0 and over, adult: Secondary | ICD-10-CM | POA: Insufficient documentation

## 2016-12-19 DIAGNOSIS — K219 Gastro-esophageal reflux disease without esophagitis: Secondary | ICD-10-CM | POA: Diagnosis not present

## 2016-12-19 DIAGNOSIS — Z01818 Encounter for other preprocedural examination: Secondary | ICD-10-CM | POA: Diagnosis not present

## 2016-12-19 NOTE — Patient Instructions (Addendum)
-   Try Bariatric Advantage chewable multivitamin.  - Try Premier Protein clear drink from [employer] for breakfast.  - Add in protein options for snacks: deli meat, reduced-fat cheese, Triple Zero or Dannon Light and Fit greek yogurt, etc.   - Eat 3 meals a day and snacks as well.   - Remove bread from daily.  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

## 2016-12-19 NOTE — Progress Notes (Signed)
Follow-up visit:  4 Weeks Post-Operative RYGB Surgery  Medical Nutrition Therapy:  Appt start time: 11:35 end time:  12:10.  Primary concerns today: Post-operative Bariatric Surgery Nutrition Management.  Non scale victories: none stated  Pt states she checks every 2 hours: this was clarified: 160-170   Surgery date: 11/18/2016 Surgery type: RYGB [facility].5 lbs Weight today: 211.8 lbs Weight change: 8 lbs from 219.8 on 12/03/2016 Total weight lost: 36.7 lbs  Weight loss goal: be more active, remission of diabetes   TANITA  BODY COMP RESULTS  12/03/2016 12/19/2016   BMI (kg/m^2) 36.6 35.2   Fat Mass (lbs) 109.4 97.4   Fat Free Mass (lbs) 110.4 114.4   Total Body Water (lbs) 80.2 82.8    Pt arrives to original appt late and RD was unable to see her, but I had a cancellation and was able to see her. Pt arrives with various complaints and concerns. Pt states she is in quite a bit of pain mostly due to hernia that was repaired along with bariatric surgery. Pt states she had to go to ER due to navel bleeding shortly after having RYGB. 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 Pt states she cannot tolerate beef. Pt reports she can tolerate chicken and beans. Pt states she is concerned that she is not eating enough and unable to tolerate things. Pt states she was unable to tolerate salad and [place]key burger with bun.    Preferred Learning Style:   No preference indicated   Learning Readiness:   Contemplating  Ready  24-hr recall: B (AM): jello Snk (AM): none  L (PM):  1/2 grilled cheese sandwich or [employer]'s-chili (17g) Snk (PM): none  D (PM): chicken noodle soup (6g) or grilled chicken (7g) & hot dog (5g) Snk (PM): none  Fluid intake: water; 72 oz daily Estimated total protein intake: ~ 29 g daily  Medications: See list Supplementation: 2 Celebrate + 3 Ca  Using straws: not  discussed Drinking while eating: not discussed Having you been chewing well: not discussed Chewing/swallowing difficulties: not discussed Changes in vision: not discussed Changes to mood/headaches: not discussed Hair loss/Changes to skin/Changes to nails: not discussed Any difficulty focusing or concentrating: not discussed Sweating: not discussed Dizziness/Lightheaded: not discussed Palpitations: not discussed  Carbonated beverages: not discussed N/V/D/C/GAS: yes, yes when eating things that are heavy (bread, beef) and eating other things (salad); diarrhea, constipation, and gas not discussed Abdominal Pain: yes, hernia and surgical sites Dumping syndrome: not discussed Last Lap-Band fill: N/A  Recent physical activity:  not discussed  Progress Towards Goal(s):  In progress.  Handouts given during visit include:  None   Nutritional Diagnosis:  NI-5.7.1 Inadequate protein intake As related to bariatric surgery post-op recommendations.  As evidenced by pt report of less than 60 grams protein daily.    Intervention:  Nutrition education and counseling. Pt was educated on the importance of eating at least 3 meals and snacks throughout her day to meet protein goals as well as maintaining the health of her body. Pt was counseled on ways to increase daily protein intake. Pt was counseled on other bariatric MVI's. Pt was encouraged to contact surgeon related to stomach/abdominal pain.   Goals: - Try Bariatric Advantage chewable multivitamin. - Try Premier Protein clear drink from [employer] for breakfast. - Add in protein options for snacks: deli meat, reduced-fat cheese, Triple Zero or Dannon  Light and Fit greek yogurt, etc.  - Eat 3 meals a day and snacks as well.  - Remove bread from daily. Designer, fashion/clothing stomach pain and work responsibilities.   Teaching Method Utilized:  Visual Auditory Hands on  Barriers to learning/adherence to lifestyle change: work-life balance,  contemplative stage of change  Demonstrated degree of understanding via:  Teach Back   Monitoring/Evaluation:  Dietary intake, exercise, lap band fills, and body weight. Follow up in 1 week for 5 week post-op visit.

## 2016-12-27 ENCOUNTER — Ambulatory Visit: Payer: BC Managed Care – PPO | Admitting: Registered"

## 2017-01-06 ENCOUNTER — Telehealth (HOSPITAL_COMMUNITY): Payer: Self-pay | Admitting: General Surgery

## 2017-01-06 NOTE — Telephone Encounter (Signed)
Laparoscopic Roux en Y gastric bypass with 40 cm BP limb and 100 cm Roux limb, antecolic, antegastric, candy cane to the left.  Closure of Peterson's defect. Upper endoscopy. Repair of umbilical hernia 11/18/16.   She called reporting that for the past 2-3 day she began having some progressively increasing left upper quadrant pain. She states it has become more severe. She chokes and oxycodone and got a little relief from that. She has little nausea but no vomiting. No fever or chills. She said she had little bit of loose stool and was trying to get a stool sample to take to the lab. Given what she described as severe pain, I told her to go to the nearest emergency room for evaluation. She lives in [place] and so she went to the [facility] emergency department. The PA there called me and stated he had evaluated her. He reported that her labs all looked good. He had obtained a CT scan which she said showed a little bit of necrotic fatty tissue in the mesentery of the left upper quadrant. 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 I advised him to give her prescription for pain medication, tell her to stay on clear liquids, and have her call the office later this morning to arrange to see Dr. Daphine Deutscher in the office.

## 2017-01-07 ENCOUNTER — Other Ambulatory Visit (HOSPITAL_COMMUNITY): Payer: Self-pay | Admitting: Surgery

## 2017-01-07 DIAGNOSIS — R1084 Generalized abdominal pain: Secondary | ICD-10-CM

## 2017-01-08 ENCOUNTER — Other Ambulatory Visit: Payer: Self-pay | Admitting: Radiology

## 2017-01-10 ENCOUNTER — Ambulatory Visit (HOSPITAL_COMMUNITY)
Admission: RE | Admit: 2017-01-10 | Discharge: 2017-01-10 | Disposition: A | Discharge status: Home / Self Care | Payer: BC Managed Care – PPO | Source: Ambulatory Visit | Attending: Surgery | Admitting: Surgery

## 2017-01-10 ENCOUNTER — Encounter (HOSPITAL_COMMUNITY): Payer: Self-pay

## 2017-01-10 DIAGNOSIS — G43909 Migraine, unspecified, not intractable, without status migrainosus: Secondary | ICD-10-CM | POA: Insufficient documentation

## 2017-01-10 DIAGNOSIS — Z91030 Bee allergy status: Secondary | ICD-10-CM | POA: Diagnosis not present

## 2017-01-10 DIAGNOSIS — Z888 Allergy status to other drugs, medicaments and biological substances status: Secondary | ICD-10-CM | POA: Diagnosis not present

## 2017-01-10 DIAGNOSIS — Z9049 Acquired absence of other specified parts of digestive tract: Secondary | ICD-10-CM | POA: Diagnosis not present

## 2017-01-10 DIAGNOSIS — Z9889 Other specified postprocedural states: Secondary | ICD-10-CM | POA: Diagnosis not present

## 2017-01-10 DIAGNOSIS — Z90710 Acquired absence of both cervix and uterus: Secondary | ICD-10-CM | POA: Diagnosis not present

## 2017-01-10 DIAGNOSIS — Z833 Family history of diabetes mellitus: Secondary | ICD-10-CM | POA: Insufficient documentation

## 2017-01-10 DIAGNOSIS — I10 Essential (primary) hypertension: Secondary | ICD-10-CM | POA: Diagnosis not present

## 2017-01-10 DIAGNOSIS — Z809 Family history of malignant neoplasm, unspecified: Secondary | ICD-10-CM | POA: Insufficient documentation

## 2017-01-10 DIAGNOSIS — T888XXA Other specified complications of surgical and medical care, not elsewhere classified, initial encounter: Secondary | ICD-10-CM | POA: Insufficient documentation

## 2017-01-10 DIAGNOSIS — Z87442 Personal history of urinary calculi: Secondary | ICD-10-CM | POA: Diagnosis not present

## 2017-01-10 DIAGNOSIS — R1084 Generalized abdominal pain: Secondary | ICD-10-CM | POA: Diagnosis present

## 2017-01-10 DIAGNOSIS — K219 Gastro-esophageal reflux disease without esophagitis: Secondary | ICD-10-CM | POA: Diagnosis not present

## 2017-01-10 DIAGNOSIS — Z8249 Family history of ischemic heart disease and other diseases of the circulatory system: Secondary | ICD-10-CM | POA: Insufficient documentation

## 2017-01-10 DIAGNOSIS — Z91013 Allergy to seafood: Secondary | ICD-10-CM | POA: Insufficient documentation

## 2017-01-10 DIAGNOSIS — Z9884 Bariatric surgery status: Secondary | ICD-10-CM | POA: Diagnosis present

## 2017-01-10 DIAGNOSIS — Z7982 Long term (current) use of aspirin: Secondary | ICD-10-CM | POA: Insufficient documentation

## 2017-01-10 DIAGNOSIS — E119 Type 2 diabetes mellitus without complications: Secondary | ICD-10-CM | POA: Diagnosis not present

## 2017-01-10 DIAGNOSIS — Z794 Long term (current) use of insulin: Secondary | ICD-10-CM | POA: Insufficient documentation

## 2017-01-10 DIAGNOSIS — M1990 Unspecified osteoarthritis, unspecified site: Secondary | ICD-10-CM | POA: Diagnosis not present

## 2017-01-10 LAB — BASIC METABOLIC PANEL
ANION GAP: 7 (ref 5–15)
BUN: 6 mg/dL (ref 6–20)
CALCIUM: 9.2 mg/dL (ref 8.9–10.3)
CO2: 28 mmol/L (ref 22–32)
Chloride: 103 mmol/L (ref 101–111)
Creatinine, Ser: 0.84 mg/dL (ref 0.44–1.00)
Glucose, Bld: 125 mg/dL — ABNORMAL HIGH (ref 65–99)
POTASSIUM: 3.3 mmol/L — AB (ref 3.5–5.1)
Sodium: 138 mmol/L (ref 135–145)

## 2017-01-10 LAB — CBC
HCT: 37.8 % (ref 36.0–46.0)
HEMOGLOBIN: 12.6 g/dL (ref 12.0–15.0)
MCH: 28.9 pg (ref 26.0–34.0)
MCHC: 33.3 g/dL (ref 30.0–36.0)
MCV: 86.7 fL (ref 78.0–100.0)
Platelets: 276 10*3/uL (ref 150–400)
RBC: 4.36 MIL/uL (ref 3.87–5.11)
RDW: 12.9 % (ref 11.5–15.5)
WBC: 7.5 10*3/uL (ref 4.0–10.5)

## 2017-01-10 LAB — PROTIME-INR
INR: 0.99
PROTHROMBIN TIME: 13 s (ref 11.4–15.2)

## 2017-01-10 LAB — APTT: APTT: 20 s — AB (ref 24–36)

## 2017-01-10 MED ORDER — FENTANYL CITRATE (PF) 100 MCG/2ML IJ SOLN
INTRAMUSCULAR | Status: AC
  Filled 2017-01-10: qty 4

## 2017-01-10 MED ORDER — FENTANYL CITRATE (PF) 100 MCG/2ML IJ SOLN
INTRAMUSCULAR | Status: AC | PRN
  Administered 2017-01-10: 50 ug via INTRAVENOUS
  Administered 2017-01-10 (×2): 25 ug via INTRAVENOUS

## 2017-01-10 MED ORDER — HYDROCODONE-ACETAMINOPHEN 5-325 MG PO TABS
ORAL_TABLET | ORAL | Status: AC
  Filled 2017-01-10: qty 1

## 2017-01-10 MED ORDER — LIDOCAINE HCL (PF) 1 % IJ SOLN
INTRAMUSCULAR | Status: AC
  Filled 2017-01-10: qty 30

## 2017-01-10 MED ORDER — HYDROCODONE-ACETAMINOPHEN 5-325 MG PO TABS
1.0000 | ORAL_TABLET | ORAL | Status: DC | PRN
  Administered 2017-01-10: 1 via ORAL

## 2017-01-10 MED ORDER — SODIUM CHLORIDE 0.9 % IV SOLN
INTRAVENOUS | Status: DC
  Administered 2017-01-10: 14:00:00 via INTRAVENOUS

## 2017-01-10 MED ORDER — MIDAZOLAM HCL 2 MG/2ML IJ SOLN
INTRAMUSCULAR | Status: AC | PRN
  Administered 2017-01-10 (×2): 1 mg via INTRAVENOUS

## 2017-01-10 MED ORDER — MIDAZOLAM HCL 2 MG/2ML IJ SOLN
INTRAMUSCULAR | Status: AC
  Filled 2017-01-10: qty 4

## 2017-01-10 NOTE — Discharge Instructions (Signed)
Needle Biopsy, Care After °Refer to this sheet in the next few weeks. These instructions provide you with information about caring for yourself after your procedure. Your health care provider may also give you more specific instructions. Your treatment has been planned according to current medical practices, but problems sometimes occur. Call your health care provider if you have any problems or questions after your procedure. °What can I expect after the procedure? °After your procedure, it is common to have soreness, bruising, or mild pain at the biopsy site. This should go away in a few days. °Follow these instructions at home: °· Rest as directed by your health care provider. °· Take medicines only as directed by your health care provider. °· There are many different ways to close and cover the biopsy site, including stitches (sutures), skin glue, and adhesive strips. Follow your health care provider's instructions about: °? Biopsy site care. °? Bandage (dressing) changes and removal. °? Biopsy site closure removal. °· Check your biopsy site every day for signs of infection. Watch for: °? Redness, swelling, or pain. °? Fluid, blood, or pus. °Contact a health care provider if: °· You have a fever. °· You have redness, swelling, or pain at the biopsy site that lasts longer than a few days. °· You have fluid, blood, or pus coming from the biopsy site. °· You feel nauseous. °· You vomit. °Get help right away if: °· You have shortness of breath. °· You have trouble breathing. °· You have chest pain. °· You feel dizzy or you faint. °· You have bleeding that does not stop with pressure or a bandage. °· You cough up blood. °· You have pain in your abdomen. °This information is not intended to replace advice given to you by your health care provider. Make sure you discuss any questions you have with your health care provider. °Document Released: 08/16/2014 Document Revised: 09/07/2015 Document Reviewed:  03/28/2014 °Elsevier Interactive Patient Education © 2018 [employer]. ° °

## 2017-01-10 NOTE — Procedures (Signed)
Interventional Radiology Procedure Note  Procedure: US guided aspiration of abdominal wall fluid collection  Complications: None  Estimated Blood Loss: < 10 mL  Small, complex area of fluid in abdominal wall near umbilicus.  Yielded only about 1 mL of bloody fluid.  Combined with 1 mL of sterile saline and sent for culture analysis.  Jodi Marble. Fredia Sorrow, M.D Pager:  [phone]

## 2017-01-10 NOTE — H&P (Signed)
Chief Complaint: Patient was seen in consultation today for abdominal seroma aspiration vs drain placement at the request of Martin,Matthew  Referring Physician(s): Martin,Matthew  Supervising Physician: Irish Lack  Patient Status: [facility] - Out-pt  History of Present Illness: Crystal Palmer is a 41 y.o. 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 Closure of Peterson's defect. Upper endoscopy. Repair of umbilical hernia 11/18/16.  Has had worsening abd pain for 3-4 days Known seroma per CT  CT 11/24/16: IMPRESSION: 1. Collection of fluid and trace air noted just inferior to the umbilicus, measuring 4.4 x 4.1 x 2.1 cm. This may reflect a postoperative seroma, though evolving abscess cannot be entirely excluded. Surrounding soft tissue inflammation. Soft tissue edema noted tracking along the anterior abdominal wall. Minimal air noted tracking about the rectus abdominis muscle more superiorly. 2. Skin thickening along the lower anterior abdominal wall. Skin thickening along the upper back.  Now scheduled for seroma aspiration vs drain placement   Past Medical History:  Diagnosis Date  . Anemia   . Arthritis   . Asthma   . Diabetes mellitus without complication (HCC)    type II  . GERD (gastroesophageal reflux disease)   . Headache    migraines   . History of kidney stones   . Hypertension     Past Surgical History:  Procedure Laterality Date  . ABDOMINAL HYSTERECTOMY    . bilateral foot surgery     . CARDIAC CATHETERIZATION     x 2  . CESAREAN SECTION     x 4  . CHOLECYSTECTOMY    . GASTRIC ROUX-EN-Y N/A 11/18/2016   Procedure: LAPAROSCOPIC ROUX-EN-Y GASTRIC BYPASS WITH UPPER ENDOSCOPY;  Surgeon: Luretha Murphy, MD;  Location: [facility] ORS;  Service: General;  Laterality: N/A;  . HERNIA REPAIR     umbilical hernia   . right knee surgery      . SINUS SURGERY WITH  INSTATRAK      Allergies: Fish allergy; Shellfish allergy; Bee venom; and Meloxicam & diet manage prod  Medications: Prior to Admission medications   Medication Sig Start Date End Date Taking? Authorizing Provider  aspirin EC 81 MG tablet Take 81 mg by mouth daily.    [provider]  butalbital-acetaminophen-caffeine (FIORICET WITH CODEINE) 50-325-40-30 MG capsule Take 1 capsule by mouth every 4 (four) hours as needed for headache.    [provider]  CALCIUM PO Take 1 tablet by mouth 3 (three) times daily.    [provider]  cyclobenzaprine (FLEXERIL) 10 MG tablet Take 10 mg by mouth 3 (three) times daily as needed for muscle spasms.    [provider]  docusate sodium (COLACE) 100 MG capsule Take 100 mg by mouth 2 (two) times daily.    [provider]  empagliflozin (JARDIANCE) 25 MG TABS tablet Take 25 mg by mouth daily. Takes in the pm    [provider]  fluconazole (DIFLUCAN) 150 MG tablet Take 150 mg by mouth daily. As needed for yeast infection    [provider]  gabapentin (NEURONTIN) 300 MG capsule Take 300 mg by mouth daily.    [provider]  insulin aspart (NOVOLOG) 100 UNIT/ML injection Inject 0-20 Units into the skin every 4 (four) hours. 11/20/16   Luretha Murphy, MD  Insulin Glargine [facility] KWIKPEN [place]) Inject 80 units of lipase into the skin daily. Patient takes in  the pm    [provider]  isosorbide mononitrate (IMDUR) 60 MG 24 hr tablet Take 60 mg by mouth daily. Patient takes in the pm    [provider]  linaclotide (LINZESS) 290 MCG CAPS capsule Take 290 mcg by mouth daily before breakfast. Patient takes in the pm    [provider]  lisinopril (PRINIVIL,ZESTRIL) 40 MG tablet Take 40 mg by mouth daily. Takes in the pm    [provider]  Melatonin 1 MG TABS Take 1 mg by mouth at bedtime as needed. Patient takes prn     [provider]  metoprolol  (LOPRESSOR) 50 MG tablet Take 50 mg by mouth 2 (two) times daily.    [provider]  Multiple Vitamin (MULTIVITAMIN) tablet Take 1 tablet by mouth 2 (two) times daily.    [provider]  nitroGLYCERIN (NITROSTAT) 0.4 MG SL tablet Place 0.4 mg under the tongue every 5 (five) minutes as needed for chest pain.    [provider]  nystatin (NYSTATIN) powder Apply 1 Bottle topically daily as needed (antifungal).     [provider]  omeprazole (PRILOSEC) 40 MG capsule Take 40 mg by mouth daily. Patient takes in the pm    [provider]  ondansetron (ZOFRAN-ODT) 4 MG disintegrating tablet Take 4 mg by mouth every 8 (eight) hours as needed for nausea or vomiting.    [provider]  pantoprazole (PROTONIX) 40 MG tablet Take 40 mg by mouth daily. Patient takes in the pm    [provider]  sitaGLIPtin-metformin (JANUMET) 50-1000 MG tablet Take 1 tablet by mouth daily. Patient takes in the p m    [provider]     Family History  Problem Relation Age of Onset  . Cancer Other   . Hypertension Other   . Diabetes Other   . Heart disease Other     Social History   Social History  . Marital status: Married    Spouse name: N/A  . Number of children: N/A  . Years of education: N/A   Social History Main Topics  . Smoking status: Never Smoker  . Smokeless tobacco: Never Used  . Alcohol use No  . Drug use: No  . Sexual activity: Not Asked   Other Topics Concern  . None   Social History Narrative  . None    Review of Systems: A 12 point ROS discussed and pertinent positives are indicated in the HPI above.  All other systems are negative.  Review of Systems  Constitutional: Positive for appetite change. Negative for activity change, fatigue and fever.  Respiratory: Negative for shortness of breath.   Cardiovascular: Negative for chest pain.  Gastrointestinal: Positive for abdominal pain.  Neurological: Negative for  weakness.  Psychiatric/Behavioral: Negative for behavioral problems and confusion.    Vital Signs: There were no vitals taken for this visit.  Physical Exam  Constitutional: She is oriented to person, place, and time.  Cardiovascular: Normal rate and regular rhythm.   Pulmonary/Chest: Effort normal and breath sounds normal.  Abdominal: Soft. Bowel sounds are normal. There is tenderness.  Musculoskeletal: Normal range of motion.  Neurological: She is alert and oriented to person, place, and time.  Skin: Skin is warm and dry.  Psychiatric: She has a normal mood and affect. Her behavior is normal. Judgment and thought content normal.  Nursing note and vitals reviewed.   Imaging: No results found.  Labs:  CBC:  Recent Labs  11/18/16 1443 11/19/16 0538 11/20/16 0541 11/24/16 1817  WBC 20.3* 13.6* 11.9* 11.0*  HGB 14.4  14.3 13.0 13.0 13.8  HCT 42.9  42.3 39.2 38.7 40.5  PLT 321 270 246 328    COAGS: No results for input(s): INR, APTT in the last 8760 hours.  BMP:  Recent Labs  11/13/16 1115 11/18/16 1443 11/24/16 1817  NA 139  --  140  K 4.0  --  3.5  CL 105  --  103  CO2 27  --  29  GLUCOSE 83  --  154*  BUN 8  --  7  CALCIUM 9.4  --  9.2  CREATININE 0.63 0.78 0.61  GFRNONAA >60 >60 >60  GFRAA >60 >60 >60    LIVER FUNCTION TESTS:  Recent Labs  11/13/16 1115 11/24/16 1817  BILITOT 0.5 0.4  AST 18 15  ALT 17 22  ALKPHOS 54 58  PROT 7.5 8.1  ALBUMIN 4.2 4.0    TUMOR MARKERS: No results for input(s): AFPTM, CEA, CA199, CHROMGRNA in the last 8760 hours.  Assessment and Plan:  Wt loss surgery/gastric bypass 11/18/2016 Noted seroma on follow up CT 8/12 post surgery New abdominal pain and worsening x 3-4 days Now scheduled for seroma aspiration/drain  Risks and benefits discussed with the patient including bleeding, infection, damage to adjacent structures,  and sepsis. All of the patient's questions were answered, patient is agreeable to  proceed. Consent signed and in chart.  Thank you for this interesting consult.  I greatly enjoyed meeting Crystal Palmer and look forward to participating in their care.  A copy of this report was sent to the requesting provider on this date.  Electronically Signed: Robet Leu, PA-C 01/10/2017, 12:39 PM   I spent a total of  30 Minutes   in face to face in clinical consultation, greater than 50% of which was counseling/coordinating care for seroma aspiration/drain

## 2017-01-15 LAB — AEROBIC/ANAEROBIC CULTURE W GRAM STAIN (SURGICAL/DEEP WOUND)
Culture: NO GROWTH
Special Requests: NORMAL

## 2017-01-15 LAB — AEROBIC/ANAEROBIC CULTURE (SURGICAL/DEEP WOUND)

## 2017-01-16 ENCOUNTER — Ambulatory Visit: Payer: BC Managed Care – PPO | Admitting: Skilled Nursing Facility1

## 2017-05-13 ENCOUNTER — Other Ambulatory Visit: Payer: Self-pay | Admitting: Surgery

## 2017-05-13 DIAGNOSIS — R1084 Generalized abdominal pain: Secondary | ICD-10-CM

## 2017-05-21 ENCOUNTER — Ambulatory Visit
Admission: RE | Admit: 2017-05-21 | Discharge: 2017-05-21 | Disposition: A | Discharge status: Home / Self Care | Payer: BC Managed Care – PPO | Source: Ambulatory Visit | Attending: Surgery | Admitting: Surgery

## 2017-05-21 DIAGNOSIS — R1084 Generalized abdominal pain: Secondary | ICD-10-CM

## 2017-05-21 MED ORDER — IOPAMIDOL (ISOVUE-300) INJECTION 61%
100.0000 mL | Freq: Once | INTRAVENOUS | Status: AC | PRN
  Administered 2017-05-21: 100 mL via INTRAVENOUS

## 2017-07-14 NOTE — Progress Notes (Signed)
Please place orders in epic pt. Has a preop 07-15-17 at 1000am. Thank you !

## 2017-07-14 NOTE — Progress Notes (Addendum)
lov 06-16-17 Dr. Wendi MayaAtwater chart  [id number] 06-16-17 chart   ekg and cxr 11-24-16 epic

## 2017-07-14 NOTE — Patient Instructions (Signed)
Crystal Palmer  07/14/2017   Your procedure is scheduled on: 07-25-17   [facility]   ARRIVE AT 530 AM. Have a seat in the Main Lobby. Please note there is a phone at the [employer]. Please call [phone] on that phone. Someone from Short Stay will come and get you from the Main Lobby and take you to Short Stay.   Call this number if you have problems the morning of surgery [phone]    Remember: Do not eat food or drink liquids :After Midnight.     Take these medicines the morning of surgery with A SIP OF WATER: omeprazole, metoprolol, Imdur(isorbide mononitrate), gabapentin DO NOT TAKE ANY DIABETIC MEDICATIONS DAY OF YOUR SURGERY                               You may not have any metal on your body including hair pins and              piercings  Do not wear jewelry, make-up, lotions, powders or perfumes, deodorant             Do not wear nail polish.  Do not shave  48 hours prior to surgery.             Do not bring valuables to the hospital. [facility] IS NOT             RESPONSIBLE   FOR VALUABLES.  Contacts, dentures or bridgework may not be worn into surgery.  Leave suitcase in the car. After surgery it may be brought to your room.                  Please read over the following fact sheets you were given: _____________________________________________________________________          [facility] - Preparing for [facility] Before surgery, you can play an important role.  Because skin is not sterile, your skin needs to be as free of germs as possible.  You can reduce the number of germs on your skin by washing with CHG (chlorahexidine gluconate) soap before surgery.  CHG is an antiseptic cleaner which kills germs and bonds with the skin to continue killing germs even after washing. Please DO NOT use if you have an allergy to CHG or antibacterial soaps.  If your skin becomes reddened/irritated stop using the CHG  and inform your nurse when you arrive at Short Stay. Do not shave (including legs and underarms) for at least 48 hours prior to the first CHG shower.  You may shave your face/neck. Please follow these instructions carefully:  1.  Shower with CHG Soap the night before surgery and the  morning of Surgery.  2.  If you choose to wash your hair, wash your hair first as usual with your  normal  shampoo.  3.  After you shampoo, rinse your hair and body thoroughly to remove the  shampoo.                           4.  Use CHG as you would any other liquid soap.  You can apply chg directly  to the skin and wash  Gently with a scrungie or clean washcloth.  5.  Apply the CHG Soap to your body ONLY FROM THE NECK DOWN.   Do not use on face/ open                           Wound or open sores. Avoid contact with eyes, ears mouth and genitals (private parts).                       Wash face,  Genitals (private parts) with your normal soap.             6.  Wash thoroughly, paying special attention to the area where your surgery  will be performed.  7.  Thoroughly rinse your body with warm water from the neck down.  8.  DO NOT shower/wash with your normal soap after using and rinsing off  the CHG Soap.                9.  Pat yourself dry with a clean towel.            10.  Wear clean pajamas.            11.  Place clean sheets on your bed the night of your first shower and do not  sleep with pets. Day of Surgery : Do not apply any lotions/deodorants the morning of surgery.  Please wear clean clothes to the hospital/surgery center.  FAILURE TO FOLLOW THESE INSTRUCTIONS MAY RESULT IN THE CANCELLATION OF YOUR SURGERY PATIENT SIGNATURE_________________________________  NURSE SIGNATURE__________________________________  ________________________________________________________________________ How to Manage Your Diabetes Before and After Surgery  Why is it important to control my blood sugar  before and after surgery? . Improving blood sugar levels before and after surgery helps healing and can limit problems. . A way of improving blood sugar control is eating a healthy diet by: o  Eating less sugar and carbohydrates o  Increasing activity/exercise o  Talking with your doctor about reaching your blood sugar goals . High blood sugars (greater than 180 mg/dL) can raise your risk of infections and slow your recovery, so you will need to focus on controlling your diabetes during the weeks before surgery. . Make sure that the doctor who takes care of your diabetes knows about your planned surgery including the date and location.  How do I manage my blood sugar before surgery? . Check your blood sugar at least 4 times a day, starting 2 days before surgery, to make sure that the level is not too high or low. o Check your blood sugar the morning of your surgery when you wake up and every 2 hours until you get to the Short Stay unit. . If your blood sugar is less than 70 mg/dL, you will need to treat for low blood sugar: o Do not take insulin. o Treat a low blood sugar (less than 70 mg/dL) with  cup of clear juice (cranberry or apple), 4 glucose tablets, OR glucose gel. o Recheck blood sugar in 15 minutes after treatment (to make sure it is greater than 70 mg/dL). If your blood sugar is not greater than 70 mg/dL on recheck, call [phone] for further instructions. . Report your blood sugar to the short stay nurse when you get to Short Stay.  . If you are admitted to the hospital after surgery: o Your blood sugar will be checked by the staff and you will probably be  given insulin after surgery (instead of oral diabetes medicines) to make sure you have good blood sugar levels. o The goal for blood sugar control after surgery is 80-180 mg/dL.   WHAT DO I DO ABOUT MY DIABETES MEDICATION?  . do not take oral diabetes medicines (pills) the mornDing of surgery  . The day of surgery, do not  take other diabetes injectables, including Byetta (exenatide), Bydureon (exenatide ER), Victoza (liraglutide), or Trulicity (dulaglutide).  If your CBG is greater than 220 mg/dL, you may take  of your sliding scale  (correction) dose of insulin     Patient Signature:  Date:   Nurse Signature:  Date:   Reviewed and Endorsed by [facility] Patient Education Committee, August 2015

## 2017-07-15 ENCOUNTER — Other Ambulatory Visit: Payer: Self-pay

## 2017-07-15 ENCOUNTER — Encounter (HOSPITAL_COMMUNITY): Payer: Self-pay

## 2017-07-15 ENCOUNTER — Encounter (HOSPITAL_COMMUNITY)
Admission: RE | Admit: 2017-07-15 | Discharge: 2017-07-15 | Disposition: A | Discharge status: Home / Self Care | Payer: BC Managed Care – PPO | Source: Ambulatory Visit | Attending: Surgery | Admitting: Surgery

## 2017-07-15 DIAGNOSIS — Z01812 Encounter for preprocedural laboratory examination: Secondary | ICD-10-CM | POA: Diagnosis present

## 2017-07-15 LAB — CBC
HCT: 41 % (ref 36.0–46.0)
Hemoglobin: 13.6 g/dL (ref 12.0–15.0)
MCH: 29.6 pg (ref 26.0–34.0)
MCHC: 33.2 g/dL (ref 30.0–36.0)
MCV: 89.3 fL (ref 78.0–100.0)
PLATELETS: 267 10*3/uL (ref 150–400)
RBC: 4.59 MIL/uL (ref 3.87–5.11)
RDW: 13.2 % (ref 11.5–15.5)
WBC: 9.7 10*3/uL (ref 4.0–10.5)

## 2017-07-15 LAB — BASIC METABOLIC PANEL
Anion gap: 7 (ref 5–15)
BUN: 10 mg/dL (ref 6–20)
CALCIUM: 8.8 mg/dL — AB (ref 8.9–10.3)
CO2: 27 mmol/L (ref 22–32)
CREATININE: 0.66 mg/dL (ref 0.44–1.00)
Chloride: 105 mmol/L (ref 101–111)
Glucose, Bld: 114 mg/dL — ABNORMAL HIGH (ref 65–99)
Potassium: 3.8 mmol/L (ref 3.5–5.1)
SODIUM: 139 mmol/L (ref 135–145)

## 2017-07-15 LAB — GLUCOSE, CAPILLARY: GLUCOSE-CAPILLARY: 89 mg/dL (ref 65–99)

## 2017-07-23 NOTE — H&P (Signed)
Chief Complaint:  Abdominal pain after roux Y gastric bypass  History of Present Illness:  Crystal Palmer is an 42 y.o. female who underwent roux en Y gastric bypass last summer along with ventral hernia repair.  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 Anemia    hx of  . Arthritis   . Asthma   . Diabetes mellitus without complication (HCC)    type II  . GERD (gastroesophageal reflux disease)   . Headache    migraines   . History of kidney stones   . Hypertension     Past Surgical History:  Procedure Laterality Date  . ABDOMINAL HYSTERECTOMY    . bilateral foot surgery     . CARDIAC CATHETERIZATION     x 2  . CESAREAN SECTION     x 4  . CHOLECYSTECTOMY    . DIAGNOSTIC LAPAROSCOPY     ventral hernia   . GASTRIC ROUX-EN-Y N/A 11/18/2016   Procedure: LAPAROSCOPIC ROUX-EN-Y GASTRIC BYPASS WITH UPPER ENDOSCOPY;  Surgeon: Luretha Murphy, MD;  Location: [facility] ORS;  Service: General;  Laterality: N/A;  . HERNIA REPAIR     umbilical hernia  x2  . right knee surgery       x3  . SINUS SURGERY WITH INSTATRAK      No current facility-administered medications for this encounter.    Current Outpatient Medications  Medication Sig Dispense Refill  . aspirin EC 81 MG tablet Take 81 mg by mouth at bedtime.     . butalbital-acetaminophen-caffeine (FIORICET WITH CODEINE) 50-325-40-30 MG capsule Take 1 capsule by mouth every 4 (four) hours as needed for headache.    . calcium carbonate (TUMS EX) 750 MG chewable tablet Chew 750 mg by mouth 3 (three) times daily.    . cyclobenzaprine (FLEXERIL) 5 MG tablet Take 5 mg by mouth 3 (three) times daily as needed for muscle spasms.    Marland Kitchen docusate sodium (COLACE) 100 MG capsule Take 100 mg by mouth 2 (two) times daily.    . fluconazole (DIFLUCAN) 150 MG tablet Take 150 mg by mouth daily as needed (for reoccurring yeast  infections.).     Marland Kitchen gabapentin (NEURONTIN) 300 MG capsule Take 300 mg by mouth 2 (two) times daily.     . insulin aspart (NOVOLOG) 100 UNIT/ML injection Inject 0-20 Units into the skin every 4 (four) hours. (Patient taking differently: Inject 0-20 Units into the skin 3 (three) times daily as needed for high blood sugar. ) 10 mL 11  . isosorbide mononitrate (IMDUR) 30 MG 24 hr tablet Take 30 mg by mouth every evening.    . linaclotide (LINZESS) 290 MCG CAPS capsule Take 290 mcg by mouth at bedtime.     Marland Kitchen lisinopril (PRINIVIL,ZESTRIL) 40 MG tablet Take 40 mg by mouth at bedtime. Takes in the pm    . Melatonin 3 MG TBDP Take 3 mg by mouth at bedtime as needed (for sleep.).    Marland Kitchen metoprolol (LOPRESSOR) 50 MG tablet Take 50 mg by mouth 2 (two) times daily.    . Multiple Vitamin (MULTIVITAMIN) tablet Take 1 tablet by mouth 3 (three) times daily.     . nitroGLYCERIN (NITROSTAT) 0.4 MG SL tablet Place 0.4 mg under the tongue every 5 (five) minutes as needed for chest pain.    Marland Kitchen nystatin (NYSTATIN) powder Apply 1 Bottle topically  daily as needed (antifungal).     Marland Kitchen. omeprazole (PRILOSEC) 40 MG capsule Take 40 mg by mouth 2 (two) times daily.     . ondansetron (ZOFRAN-ODT) 4 MG disintegrating tablet Take 4 mg by mouth every 8 (eight) hours as needed for nausea or vomiting.    . sitaGLIPtin (JANUVIA) 100 MG tablet Take 100 mg by mouth at bedtime.    . traMADol (ULTRAM) 50 MG tablet Take 50 mg by mouth every 6 (six) hours as needed (for hip/back pain.).     Fish allergy; Shellfish allergy; Bee venom; and Meloxicam & diet manage prod Family History  Problem Relation Age of Onset  . Cancer Other   . Hypertension Other   . Diabetes Other   . Heart disease Other    Social History:   reports that she has never smoked. She has never used smokeless tobacco. She reports that she does not drink alcohol or use drugs.   REVIEW OF SYSTEMS : Negative except for symptoms referable to PI  Physical Exam:   There  were no vitals taken for this visit. There is no height or weight on file to calculate BMI.  Gen:  WDWN AAF NAD  Neurological: Alert and oriented to person, place, and time. Motor and sensory function is grossly intact  Head: Normocephalic and atraumatic.  Eyes: Conjunctivae are normal. Pupils are equal, round, and reactive to light. No scleral icterus.  Neck: Normal range of motion. Neck supple. No tracheal deviation or thyromegaly present.  Cardiovascular:  SR without murmurs or gallops.  No carotid bruits Breast:  Not examined Respiratory: Effort normal.  No respiratory distress. No chest wall tenderness. Breath sounds normal.  No wheezes, rales or rhonchi.  Abdomen:  Chronic abdominal pain GU:  Not examined Musculoskeletal: Normal range of motion. Extremities are nontender. No cyanosis, edema or clubbing noted Lymphadenopathy: No cervical, preauricular, postauricular or axillary adenopathy is present Skin: Skin is warm and dry. No rash noted. No diaphoresis. No erythema. No pallor. Pscyh: Normal mood and affect. Behavior is normal. Judgment and thought content normal.   LABORATORY RESULTS: No results found for this or any previous visit (from the past 48 hour(s)).   RADIOLOGY RESULTS: No results found.  Problem List: Patient Active Problem List   Diagnosis Date Noted  . S/P gastric bypass 11/18/2016    Assessment & Plan: Abdominal pain post gastric bypass with possible recurrent umbilical hernia.      Matt B. Daphine DeutscherMartin, MD, [facility]  [facility] [phone] beeper [phone]  07/23/2017 6:27 AM

## 2017-07-24 NOTE — Anesthesia Preprocedure Evaluation (Addendum)
Anesthesia Evaluation  Patient identified by MRN, date of birth, ID band Patient awake    Reviewed: Allergy & Precautions, NPO status , Patient's Chart, lab work & pertinent test results, reviewed documented beta blocker date and time   Airway Mallampati: I  TM Distance: >3 FB Neck ROM: Full    Dental no notable dental hx. (+) Dental Advisory Given, Teeth Intact   Pulmonary asthma ,    Pulmonary exam normal breath sounds clear to auscultation       Cardiovascular hypertension, Pt. on medications and Pt. on home beta blockers Normal cardiovascular exam Rhythm:Regular Rate:Normal  '18 Cath (Care Everywhere) - 1. Chest pain syndrome noncardiac nonischemic 2. Angiographically normal for age epicardial coronary arteries 3. Normal left ventricular function   Neuro/Psych  Headaches, negative psych ROS   GI/Hepatic Neg liver ROS, GERD  Medicated and Controlled,S/p gastric bypass   Endo/Other  diabetes, Well Controlled, Type 2, Oral Hypoglycemic Agents  Renal/GU negative Renal ROS  negative genitourinary   Musculoskeletal  (+) Arthritis ,   Abdominal   Peds negative pediatric ROS (+)  Hematology negative hematology ROS (+)   Anesthesia Other Findings   Reproductive/Obstetrics                            Anesthesia Physical  Anesthesia Plan  ASA: III  Anesthesia Plan: General   Post-op Pain Management:    Induction: Intravenous  PONV Risk Score and Plan: 3 and Ondansetron, Dexamethasone, Treatment may vary due to age or medical condition, Midazolam and Scopolamine patch - Pre-op  Airway Management Planned: Oral ETT  Additional Equipment: None  Intra-op Plan:   Post-operative Plan: Extubation in OR  Informed Consent: I have reviewed the patients History and Physical, chart, labs and discussed the procedure including the risks, benefits and alternatives for the proposed anesthesia with  the patient or authorized representative who has indicated his/her understanding and acceptance.   Dental Advisory Given  Plan Discussed with: CRNA and Anesthesiologist  Anesthesia Plan Comments:        Anesthesia Quick Evaluation

## 2017-07-25 ENCOUNTER — Other Ambulatory Visit: Payer: Self-pay

## 2017-07-25 ENCOUNTER — Ambulatory Visit (HOSPITAL_COMMUNITY): Payer: BC Managed Care – PPO | Admitting: Anesthesiology

## 2017-07-25 ENCOUNTER — Encounter (HOSPITAL_COMMUNITY): Payer: Self-pay | Admitting: *Deleted

## 2017-07-25 ENCOUNTER — Encounter (HOSPITAL_COMMUNITY)
Admission: RE | Disposition: A | Discharge status: Home / Self Care | Payer: Self-pay | Source: Ambulatory Visit | Attending: Surgery

## 2017-07-25 ENCOUNTER — Ambulatory Visit (HOSPITAL_COMMUNITY)
Admission: RE | Admit: 2017-07-25 | Discharge: 2017-07-25 | Disposition: A | Discharge status: Home / Self Care | Payer: BC Managed Care – PPO | Source: Ambulatory Visit | Attending: Surgery | Admitting: Surgery

## 2017-07-25 DIAGNOSIS — R1012 Left upper quadrant pain: Secondary | ICD-10-CM | POA: Insufficient documentation

## 2017-07-25 DIAGNOSIS — M1990 Unspecified osteoarthritis, unspecified site: Secondary | ICD-10-CM | POA: Insufficient documentation

## 2017-07-25 DIAGNOSIS — Z91030 Bee allergy status: Secondary | ICD-10-CM | POA: Insufficient documentation

## 2017-07-25 DIAGNOSIS — Z886 Allergy status to analgesic agent status: Secondary | ICD-10-CM | POA: Insufficient documentation

## 2017-07-25 DIAGNOSIS — Z79899 Other long term (current) drug therapy: Secondary | ICD-10-CM | POA: Diagnosis not present

## 2017-07-25 DIAGNOSIS — K219 Gastro-esophageal reflux disease without esophagitis: Secondary | ICD-10-CM | POA: Insufficient documentation

## 2017-07-25 DIAGNOSIS — E119 Type 2 diabetes mellitus without complications: Secondary | ICD-10-CM | POA: Insufficient documentation

## 2017-07-25 DIAGNOSIS — K432 Incisional hernia without obstruction or gangrene: Secondary | ICD-10-CM | POA: Insufficient documentation

## 2017-07-25 DIAGNOSIS — K660 Peritoneal adhesions (postprocedural) (postinfection): Secondary | ICD-10-CM | POA: Insufficient documentation

## 2017-07-25 DIAGNOSIS — Z91013 Allergy to seafood: Secondary | ICD-10-CM | POA: Diagnosis not present

## 2017-07-25 DIAGNOSIS — Z7982 Long term (current) use of aspirin: Secondary | ICD-10-CM | POA: Diagnosis not present

## 2017-07-25 DIAGNOSIS — Z9884 Bariatric surgery status: Secondary | ICD-10-CM | POA: Insufficient documentation

## 2017-07-25 DIAGNOSIS — I10 Essential (primary) hypertension: Secondary | ICD-10-CM | POA: Insufficient documentation

## 2017-07-25 HISTORY — PX: VENTRAL HERNIA REPAIR: SHX424

## 2017-07-25 HISTORY — PX: ESOPHAGOGASTRODUODENOSCOPY: SHX5428

## 2017-07-25 LAB — GLUCOSE, CAPILLARY
GLUCOSE-CAPILLARY: 169 mg/dL — AB (ref 65–99)
Glucose-Capillary: 103 mg/dL — ABNORMAL HIGH (ref 65–99)

## 2017-07-25 SURGERY — REPAIR, HERNIA, VENTRAL, LAPAROSCOPIC
Anesthesia: General

## 2017-07-25 MED ORDER — MIDAZOLAM HCL 2 MG/2ML IJ SOLN
INTRAMUSCULAR | Status: AC
  Filled 2017-07-25: qty 2

## 2017-07-25 MED ORDER — SUGAMMADEX SODIUM 200 MG/2ML IV SOLN
INTRAVENOUS | Status: DC | PRN
  Administered 2017-07-25: 200 mg via INTRAVENOUS

## 2017-07-25 MED ORDER — KETAMINE HCL 10 MG/ML IJ SOLN
INTRAMUSCULAR | Status: DC | PRN
  Administered 2017-07-25: 30 mg via INTRAVENOUS

## 2017-07-25 MED ORDER — SUGAMMADEX SODIUM 200 MG/2ML IV SOLN
INTRAVENOUS | Status: AC
  Filled 2017-07-25: qty 2

## 2017-07-25 MED ORDER — GABAPENTIN 300 MG PO CAPS
300.0000 mg | ORAL_CAPSULE | ORAL | Status: AC
  Administered 2017-07-25: 300 mg via ORAL
  Filled 2017-07-25: qty 1

## 2017-07-25 MED ORDER — LIDOCAINE 2% (20 MG/ML) 5 ML SYRINGE
INTRAMUSCULAR | Status: DC | PRN
  Administered 2017-07-25: 1.5 mg/kg/h via INTRAVENOUS

## 2017-07-25 MED ORDER — PROMETHAZINE HCL 25 MG/ML IJ SOLN
6.2500 mg | INTRAMUSCULAR | Status: DC | PRN
  Administered 2017-07-25: 6.25 mg via INTRAVENOUS

## 2017-07-25 MED ORDER — PROMETHAZINE HCL 25 MG/ML IJ SOLN
INTRAMUSCULAR | Status: AC
  Filled 2017-07-25: qty 1

## 2017-07-25 MED ORDER — ABDOMINAL BINDER/ELASTIC 2XL MISC: 0 refills | Status: AC

## 2017-07-25 MED ORDER — LIDOCAINE 2% (20 MG/ML) 5 ML SYRINGE
INTRAMUSCULAR | Status: AC
  Filled 2017-07-25: qty 5

## 2017-07-25 MED ORDER — LIDOCAINE 2% (20 MG/ML) 5 ML SYRINGE
INTRAMUSCULAR | Status: DC | PRN
  Administered 2017-07-25: 80 mg via INTRAVENOUS

## 2017-07-25 MED ORDER — FENTANYL CITRATE (PF) 100 MCG/2ML IJ SOLN
25.0000 ug | INTRAMUSCULAR | Status: DC | PRN
  Administered 2017-07-25 (×3): 50 ug via INTRAVENOUS

## 2017-07-25 MED ORDER — LIDOCAINE HCL 2 % IJ SOLN
INTRAMUSCULAR | Status: AC
  Filled 2017-07-25: qty 20

## 2017-07-25 MED ORDER — LABETALOL HCL 5 MG/ML IV SOLN
5.0000 mg | INTRAVENOUS | Status: AC | PRN
  Administered 2017-07-25 (×2): 5 mg via INTRAVENOUS

## 2017-07-25 MED ORDER — SCOPOLAMINE 1 MG/3DAYS TD PT72
MEDICATED_PATCH | TRANSDERMAL | Status: AC
  Filled 2017-07-25: qty 1

## 2017-07-25 MED ORDER — KETAMINE HCL 10 MG/ML IJ SOLN
INTRAMUSCULAR | Status: AC
  Filled 2017-07-25: qty 1

## 2017-07-25 MED ORDER — PROPOFOL 10 MG/ML IV BOLUS
INTRAVENOUS | Status: DC | PRN
  Administered 2017-07-25: 200 mg via INTRAVENOUS

## 2017-07-25 MED ORDER — LABETALOL HCL 5 MG/ML IV SOLN
INTRAVENOUS | Status: AC
  Filled 2017-07-25: qty 4

## 2017-07-25 MED ORDER — 0.9 % SODIUM CHLORIDE (POUR BTL) OPTIME
TOPICAL | Status: DC | PRN
  Administered 2017-07-25: 1000 mL

## 2017-07-25 MED ORDER — LABETALOL HCL 5 MG/ML IV SOLN
INTRAVENOUS | Status: DC | PRN
  Administered 2017-07-25 (×4): 2.5 mg via INTRAVENOUS

## 2017-07-25 MED ORDER — PROPOFOL 10 MG/ML IV BOLUS
INTRAVENOUS | Status: AC
  Filled 2017-07-25: qty 20

## 2017-07-25 MED ORDER — CEFAZOLIN SODIUM-DEXTROSE 2-4 GM/100ML-% IV SOLN
2.0000 g | INTRAVENOUS | Status: AC
  Administered 2017-07-25: 2 g via INTRAVENOUS
  Filled 2017-07-25: qty 100

## 2017-07-25 MED ORDER — FENTANYL CITRATE (PF) 250 MCG/5ML IJ SOLN
INTRAMUSCULAR | Status: AC
  Filled 2017-07-25: qty 5

## 2017-07-25 MED ORDER — LABETALOL HCL 5 MG/ML IV SOLN: 10.0000 mg | INTRAVENOUS | Status: DC | PRN

## 2017-07-25 MED ORDER — SUCCINYLCHOLINE CHLORIDE 200 MG/10ML IV SOSY
PREFILLED_SYRINGE | INTRAVENOUS | Status: AC
  Filled 2017-07-25: qty 10

## 2017-07-25 MED ORDER — ROCURONIUM BROMIDE 50 MG/5ML IV SOSY
PREFILLED_SYRINGE | INTRAVENOUS | Status: DC | PRN
  Administered 2017-07-25: 60 mg via INTRAVENOUS

## 2017-07-25 MED ORDER — CHLORHEXIDINE GLUCONATE CLOTH 2 % EX PADS: 6.0000 | MEDICATED_PAD | Freq: Once | CUTANEOUS | Status: DC

## 2017-07-25 MED ORDER — HEPARIN SODIUM (PORCINE) 5000 UNIT/ML IJ SOLN
5000.0000 [IU] | Freq: Once | INTRAMUSCULAR | Status: AC
  Administered 2017-07-25: 5000 [IU] via SUBCUTANEOUS
  Filled 2017-07-25: qty 1

## 2017-07-25 MED ORDER — ACETAMINOPHEN 500 MG PO TABS
1000.0000 mg | ORAL_TABLET | ORAL | Status: AC
  Administered 2017-07-25: 1000 mg via ORAL
  Filled 2017-07-25: qty 2

## 2017-07-25 MED ORDER — BUPIVACAINE HCL (PF) 0.25 % IJ SOLN
INTRAMUSCULAR | Status: AC
  Filled 2017-07-25: qty 30

## 2017-07-25 MED ORDER — BUPIVACAINE LIPOSOME 1.3 % IJ SUSP
20.0000 mL | Freq: Once | INTRAMUSCULAR | Status: AC
  Administered 2017-07-25: 20 mL
  Filled 2017-07-25: qty 20

## 2017-07-25 MED ORDER — SCOPOLAMINE 1 MG/3DAYS TD PT72
MEDICATED_PATCH | TRANSDERMAL | Status: DC | PRN
  Administered 2017-07-25: 1 via TRANSDERMAL

## 2017-07-25 MED ORDER — ROCURONIUM BROMIDE 10 MG/ML (PF) SYRINGE
PREFILLED_SYRINGE | INTRAVENOUS | Status: AC
  Filled 2017-07-25: qty 5

## 2017-07-25 MED ORDER — FENTANYL CITRATE (PF) 100 MCG/2ML IJ SOLN
INTRAMUSCULAR | Status: AC
  Filled 2017-07-25: qty 2

## 2017-07-25 MED ORDER — ONDANSETRON HCL 4 MG/2ML IJ SOLN
INTRAMUSCULAR | Status: DC | PRN
  Administered 2017-07-25: 4 mg via INTRAVENOUS

## 2017-07-25 MED ORDER — LACTATED RINGERS IV SOLN
INTRAVENOUS | Status: DC
  Administered 2017-07-25: 07:00:00 via INTRAVENOUS

## 2017-07-25 MED ORDER — ONDANSETRON HCL 4 MG/2ML IJ SOLN
INTRAMUSCULAR | Status: AC
  Filled 2017-07-25: qty 2

## 2017-07-25 MED ORDER — HYDRALAZINE HCL 20 MG/ML IJ SOLN
INTRAMUSCULAR | Status: AC
  Administered 2017-07-25: 10 mg
  Filled 2017-07-25: qty 1

## 2017-07-25 MED ORDER — BUPIVACAINE-EPINEPHRINE (PF) 0.5% -1:200000 IJ SOLN
INTRAMUSCULAR | Status: AC
  Filled 2017-07-25: qty 30

## 2017-07-25 MED ORDER — DEXAMETHASONE SODIUM PHOSPHATE 10 MG/ML IJ SOLN
INTRAMUSCULAR | Status: AC
  Filled 2017-07-25: qty 1

## 2017-07-25 MED ORDER — DEXAMETHASONE SODIUM PHOSPHATE 10 MG/ML IJ SOLN
INTRAMUSCULAR | Status: DC | PRN
  Administered 2017-07-25: 6 mg via INTRAVENOUS

## 2017-07-25 MED ORDER — MIDAZOLAM HCL 5 MG/5ML IJ SOLN
INTRAMUSCULAR | Status: DC | PRN
  Administered 2017-07-25 (×2): 1 mg via INTRAVENOUS

## 2017-07-25 MED ORDER — FENTANYL CITRATE (PF) 250 MCG/5ML IJ SOLN
INTRAMUSCULAR | Status: DC | PRN
  Administered 2017-07-25: 50 ug via INTRAVENOUS
  Administered 2017-07-25: 100 ug via INTRAVENOUS
  Administered 2017-07-25 (×2): 50 ug via INTRAVENOUS

## 2017-07-25 MED ORDER — OXYCODONE HCL 5 MG PO TABS: 5.0000 mg | ORAL_TABLET | Freq: Once | ORAL | Status: DC | PRN

## 2017-07-25 MED ORDER — OXYCODONE HCL 5 MG PO TABS
5.0000 mg | ORAL_TABLET | Freq: Four times a day (QID) | ORAL | 0 refills | Status: AC | PRN
Start: 2017-07-25 — End: ?

## 2017-07-25 MED ORDER — OXYCODONE HCL 5 MG/5ML PO SOLN
5.0000 mg | Freq: Once | ORAL | Status: DC | PRN
  Filled 2017-07-25: qty 5

## 2017-07-25 MED ORDER — PHENYLEPHRINE 40 MCG/ML (10ML) SYRINGE FOR IV PUSH (FOR BLOOD PRESSURE SUPPORT)
PREFILLED_SYRINGE | INTRAVENOUS | Status: AC
  Filled 2017-07-25: qty 10

## 2017-07-25 SURGICAL SUPPLY — 39 items
BINDER ABDOMINAL 12 ML 46-62 (SOFTGOODS) ×2 IMPLANT
CABLE HIGH FREQUENCY MONO STRZ (ELECTRODE) ×2 IMPLANT
COVER SURGICAL LIGHT HANDLE (MISCELLANEOUS) ×2 IMPLANT
DERMABOND ADVANCED (GAUZE/BANDAGES/DRESSINGS) ×1
DERMABOND ADVANCED .7 DNX12 (GAUZE/BANDAGES/DRESSINGS) ×1 IMPLANT
DEVICE SECURE STRAP 25 ABSORB (INSTRUMENTS) ×2 IMPLANT
DEVICE TROCAR PUNCTURE CLOSURE (ENDOMECHANICALS) ×2 IMPLANT
DISSECTOR BLUNT TIP ENDO 5MM (MISCELLANEOUS) IMPLANT
ELECT PENCIL ROCKER SW 15FT (MISCELLANEOUS) ×2 IMPLANT
ELECT REM PT RETURN 15FT ADLT (MISCELLANEOUS) ×2 IMPLANT
GLOVE BIOGEL M 8.0 STRL (GLOVE) ×2 IMPLANT
GLOVE BIOGEL PI IND STRL 7.0 (GLOVE) ×3 IMPLANT
GLOVE BIOGEL PI INDICATOR 7.0 (GLOVE) ×3
GLOVE SURG SS PI 7.0 STRL IVOR (GLOVE) ×6 IMPLANT
GOWN STRL REUS W/TWL XL LVL3 (GOWN DISPOSABLE) ×6 IMPLANT
KIT BASIN OR (CUSTOM PROCEDURE TRAY) ×2 IMPLANT
MARKER SKIN DUAL TIP RULER LAB (MISCELLANEOUS) ×2 IMPLANT
MESH VENTRALEX ST 8CM LRG (Mesh General) ×2 IMPLANT
NEEDLE SPNL 22GX3.5 QUINCKE BK (NEEDLE) ×2 IMPLANT
SCRUB TECHNI CARE 4 OZ NO DYE (MISCELLANEOUS) ×2 IMPLANT
SET IRRIG TUBING LAPAROSCOPIC (IRRIGATION / IRRIGATOR) IMPLANT
SHEARS HARMONIC ACE PLUS 45CM (MISCELLANEOUS) IMPLANT
SLEEVE XCEL OPT CAN 5 100 (ENDOMECHANICALS) ×6 IMPLANT
STAPLER VISISTAT 35W (STAPLE) ×2 IMPLANT
SUT NOVA NAB DX-16 0-1 5-0 T12 (SUTURE) ×2 IMPLANT
SUT VIC AB 0 UR5 27 (SUTURE) ×4 IMPLANT
SUT VIC AB 3-0 SH 27 (SUTURE) ×1
SUT VIC AB 3-0 SH 27X BRD (SUTURE) ×1 IMPLANT
SUT VIC AB 4-0 SH 18 (SUTURE) ×4 IMPLANT
SUT VICRYL 0 UR6 27IN ABS (SUTURE) ×2 IMPLANT
TACKER 5MM HERNIA 3.5CML NAB (ENDOMECHANICALS) IMPLANT
TOWEL OR 17X26 10 PK STRL BLUE (TOWEL DISPOSABLE) ×2 IMPLANT
TOWEL OR NON WOVEN STRL DISP B (DISPOSABLE) ×2 IMPLANT
TRAY FOLEY CATH 14FR (SET/KITS/TRAYS/PACK) ×2 IMPLANT
TRAY LAPAROSCOPIC (CUSTOM PROCEDURE TRAY) ×2 IMPLANT
TROCAR BLADELESS OPT 5 100 (ENDOMECHANICALS) ×2 IMPLANT
TROCAR XCEL NON-BLD 11X100MML (ENDOMECHANICALS) IMPLANT
TUBING ENDO SMARTCAP PENTAX (MISCELLANEOUS) ×2 IMPLANT
TUBING INSUF HEATED (TUBING) ×2 IMPLANT

## 2017-07-25 NOTE — Anesthesia Postprocedure Evaluation (Signed)
Anesthesia Post Note  Patient: Margarette CanadaShektha W Hockman  Procedure(s) Performed: LAPAROSCOPIC VENTRAL HERNIA ERAS PATHWAY (N/A ) ESOPHAGOGASTRODUODENOSCOPY (EGD) (N/A )     Patient location during evaluation: PACU Anesthesia Type: General Level of consciousness: awake and alert Pain management: pain level controlled Vital Signs Assessment: post-procedure vital signs reviewed and stable Respiratory status: spontaneous breathing, nonlabored ventilation and respiratory function stable Cardiovascular status: blood pressure returned to baseline and stable Postop Assessment: no apparent nausea or vomiting Anesthetic complications: no    Last Vitals:  Vitals:   07/25/17 1200 07/25/17 1215  BP: (!) 168/92 (!) 160/84  Pulse: 81 86  Resp: 14 16  Temp: (!) 36.3 C   SpO2: 100% 96%                  Beryle Lathehomas E Brock

## 2017-07-25 NOTE — Interval H&P Note (Signed)
History and Physical Interval Note:  07/25/2017 7:24 AM  Crystal Palmer  has presented today for surgery, with the diagnosis of abdominal pain post gastric bypass  The various methods of treatment have been discussed with the patient and family. After consideration of risks, benefits and other options for treatment, the patient has consented to  Procedure(s): ESOPHAGOGASTRODUODENOSCOPY (EGD) (N/A) LAPAROSCOPIC VENTRAL HERNIA ERAS PATHWAY (N/A) as a surgical intervention .  The patient's history has been reviewed, patient examined, no change in status, stable for surgery.  I have reviewed the patient's chart and labs.  Questions were answered to the patient's satisfaction.     Valarie MerinoMatthew B Perkins Molina

## 2017-07-25 NOTE — Op Note (Signed)
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  After anesthesia was administered and the patient was prepped a timeout was performed.  Access achieved with a 5 mm Optiview through the left upper quadrant.  I was then able to survey the midline in the right lower quadrant and pelvis in the right upper quadrant.  There appeared that there was a tiny recurrence of the ventral hernia enough to pull and probably cause pain there.  I placed a 5 mm in the left lower quadrant and right lower and right upper quadrant as I was going to need to repair the hernia.  I placed the camera on the right side and could see that in the left upper quadrant near where I placed a trocar there were dense adhesions involving the small intestine and these were taken down sharply without any problem.  This was in the area that I had marked where she was having the pain.  When these had been lysed I then turned the camera back from the patient's left side focused on the right lower quadrant and identified the ileocecal valve and then ran the small bowel all the way from there up to the jejuno-jejunostomy.  I saw no evidence of an internal hernia there and I then ran the Roux limb up and at this point there were no irregularities about that.  I can lift up the liver and see the anastomosis.  The candycane  was was prominent but I did not want to resect that in the face of placing mesh and also I did not think that it was the cause of her pain.  With the endoscope in place I then set about to fix distal hernia.  Cut out the old scar and made an entrance and with my finger was able to feel around this weakened area.  I felt that a 3.2 inch circular piece of mesh placed through this opening would would sufficiently cover this defect.  I could see the permanent suture material that is been used in the prior primary repair at the time of her bypass.  I deployed this through the opening with the laparoscopic vision and it deployed nicely.  Sutured to the parameter with at least 4 sutures using 0 Vicryl in the UR 6 needle.  I then used the absorbable tacker to tack it from all sides and it laid nicely over that entire area.  Next I went up to the head of the table and passed the endoscope endoscopically visualizing the esophagus on the way to the EG junction..  The EG junction was at about 38 cm and I entered her small gastric pouch and went through her gastrojejunostomy.  There was no evidence of any ulcer at the gas the anastomosis.  The pouch was small and the outflow down the efferent limb  looked good.  I decompressed and removed the scope.  The umbilical longitudinal incision was closed with interrupted 4-0 Vicryls and then some 2-0 Vicryl was down approximating the fascia over the closure.  The 4 incisions were closed with 4-0 Monocryl.  All the injection all of the incisions were injected with Exparel using a total of 20 cc.  Dermabond was applied.  The patient tolerated the procedure well and was taken to the PACU in stable condition.     Matt B. Daphine DeutscherMartin, MD, [facility], [place] [phone]

## 2017-07-25 NOTE — Transfer of Care (Signed)
Immediate Anesthesia Transfer of Care Note  Patient: Crystal Palmer  Procedure(s) Performed: LAPAROSCOPIC VENTRAL HERNIA ERAS PATHWAY (N/A ) ESOPHAGOGASTRODUODENOSCOPY (EGD) (N/A )  Patient Location: PACU  Anesthesia Type:General  Level of Consciousness: awake, alert  and oriented  Airway & Oxygen Therapy: Patient Spontanous Breathing and Patient connected to face mask oxygen  Post-op Assessment: Report given to RN and Post -op Vital signs reviewed and stable  Post vital signs: Reviewed and stable  Last Vitals:  Vitals Value Taken Time  BP 184/102 07/25/2017  9:52 AM  Temp    Pulse 80 07/25/2017  9:52 AM  Resp    SpO2 100 % 07/25/2017  9:52 AM  Vitals shown include unvalidated device data.  Last Pain:  Vitals:   07/25/17 0619  TempSrc:   PainSc: 6          Complications: No apparent anesthesia complications

## 2017-07-25 NOTE — Discharge Instructions (Signed)
Shower tomorrow Wear abdominal binder as needed.

## 2017-07-25 NOTE — Anesthesia Procedure Notes (Signed)
Procedure Name: Intubation Date/Time: 07/25/2017 7:43 AM Performed by: Maxwell Caul, CRNA Pre-anesthesia Checklist: Patient identified, Emergency Drugs available, Suction available and Patient being monitored Patient Re-evaluated:Patient Re-evaluated prior to induction Oxygen Delivery Method: Circle system utilized Preoxygenation: Pre-oxygenation with 100% oxygen Induction Type: IV induction Ventilation: Mask ventilation without difficulty Laryngoscope Size: Mac and 4 Grade View: Grade II Tube type: Oral Tube size: 7.5 mm Number of attempts: 1 Airway Equipment and Method: Stylet Placement Confirmation: ETT inserted through vocal cords under direct vision,  positive ETCO2 and breath sounds checked- equal and bilateral Secured at: 21 cm Tube secured with: Tape Dental Injury: Teeth and Oropharynx as per pre-operative assessment

## 2017-07-28 ENCOUNTER — Encounter (HOSPITAL_COMMUNITY): Payer: Self-pay | Admitting: Surgery

## 2017-07-30 ENCOUNTER — Encounter (HOSPITAL_COMMUNITY): Payer: Self-pay | Admitting: Surgery

## 2017-11-18 ENCOUNTER — Other Ambulatory Visit: Payer: Self-pay | Admitting: Surgery

## 2017-11-18 DIAGNOSIS — K219 Gastro-esophageal reflux disease without esophagitis: Secondary | ICD-10-CM

## 2017-11-21 ENCOUNTER — Other Ambulatory Visit: Payer: BC Managed Care – PPO

## 2017-12-09 ENCOUNTER — Ambulatory Visit
Admission: RE | Admit: 2017-12-09 | Discharge: 2017-12-09 | Disposition: A | Discharge status: Home / Self Care | Payer: BC Managed Care – PPO | Source: Ambulatory Visit | Attending: Surgery | Admitting: Surgery

## 2017-12-09 DIAGNOSIS — K219 Gastro-esophageal reflux disease without esophagitis: Secondary | ICD-10-CM

## 2018-06-25 IMAGING — CR DG CHEST 2V
2 series · 2 of 2 positions shown · non-contrast
Comparison: Chest x-ray 08/01/2016.

CLINICAL DATA: 41-year-old female with history of asthma. Central
chest pain. Shortness of breath.

EXAM:
CHEST  2 VIEW

[w chest lat]
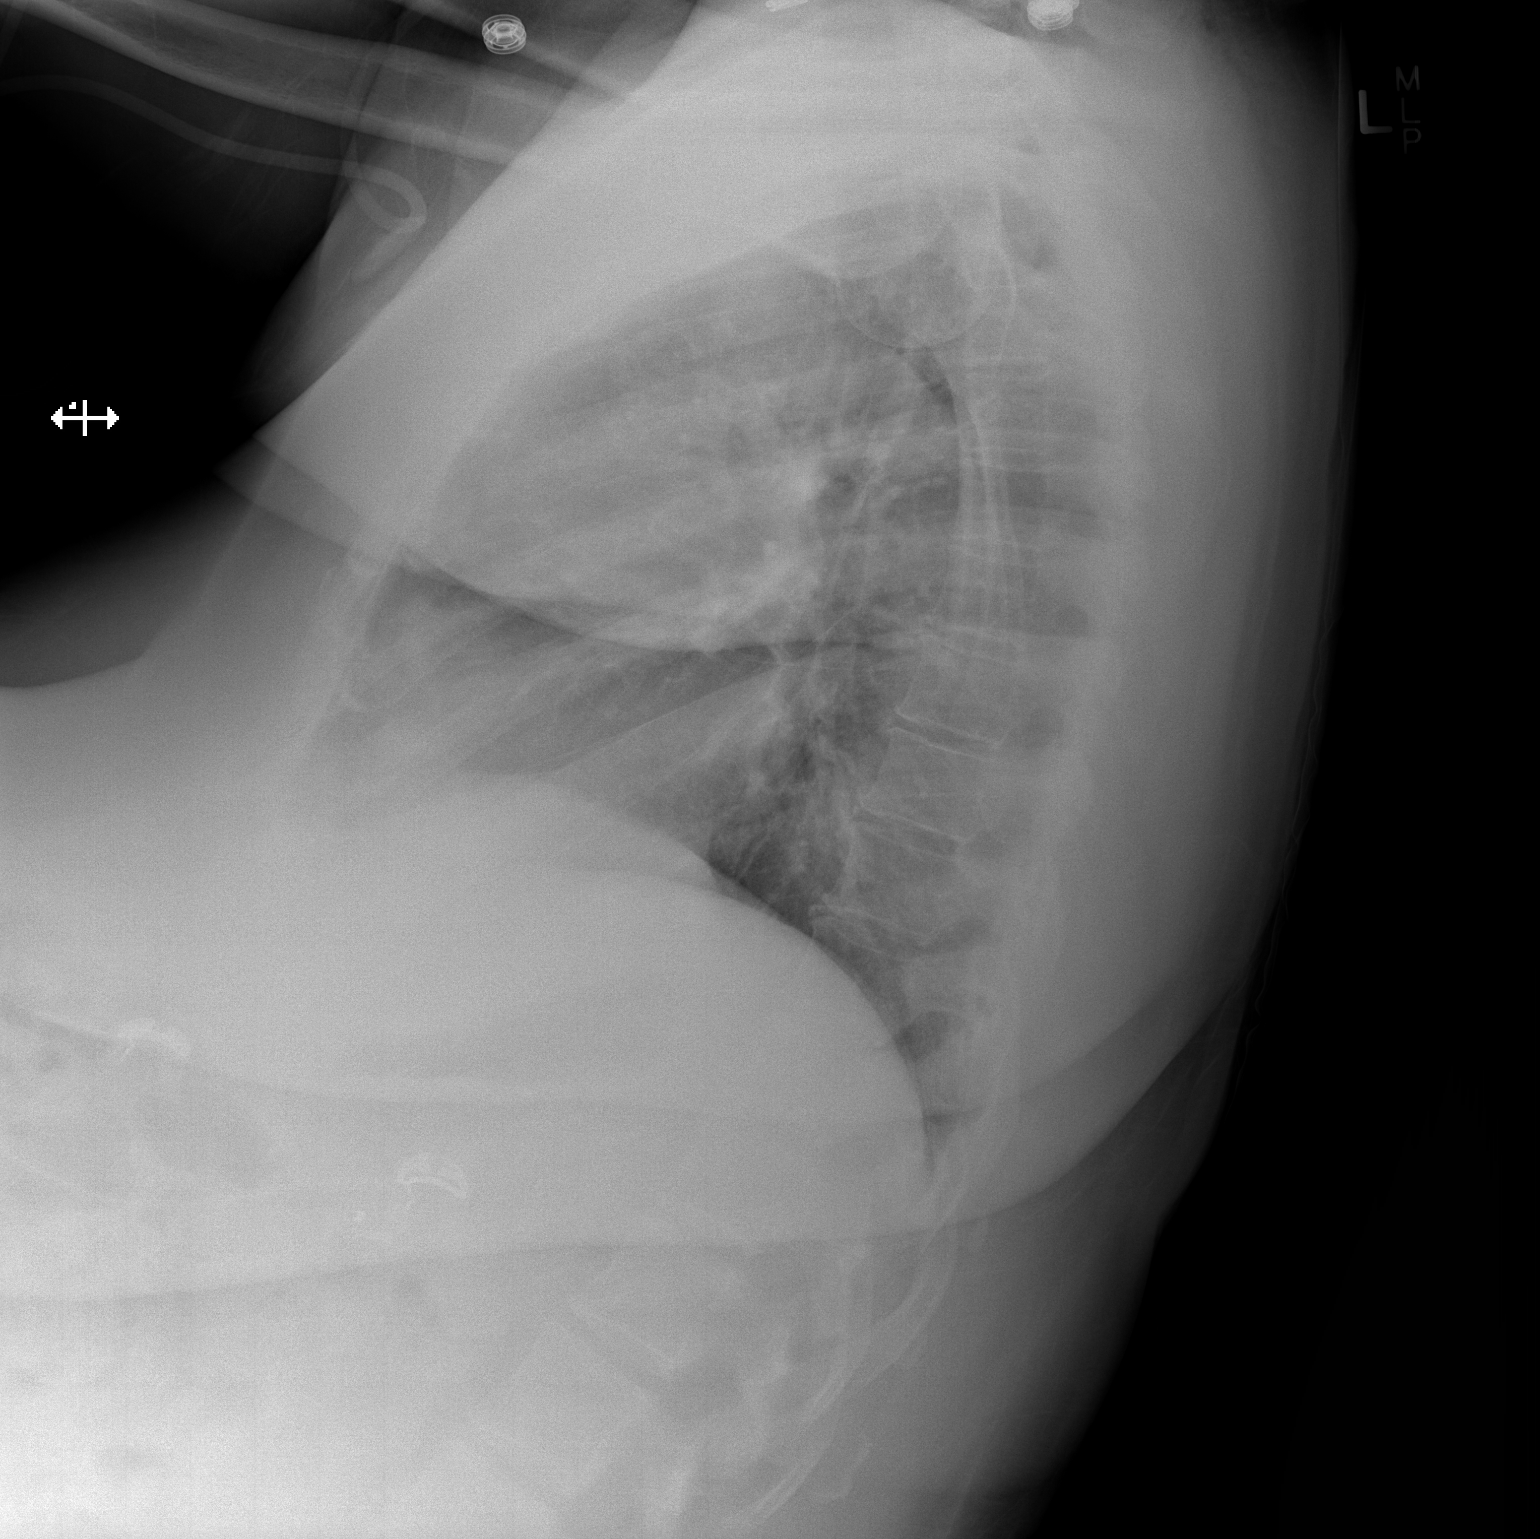

[x chest ap]
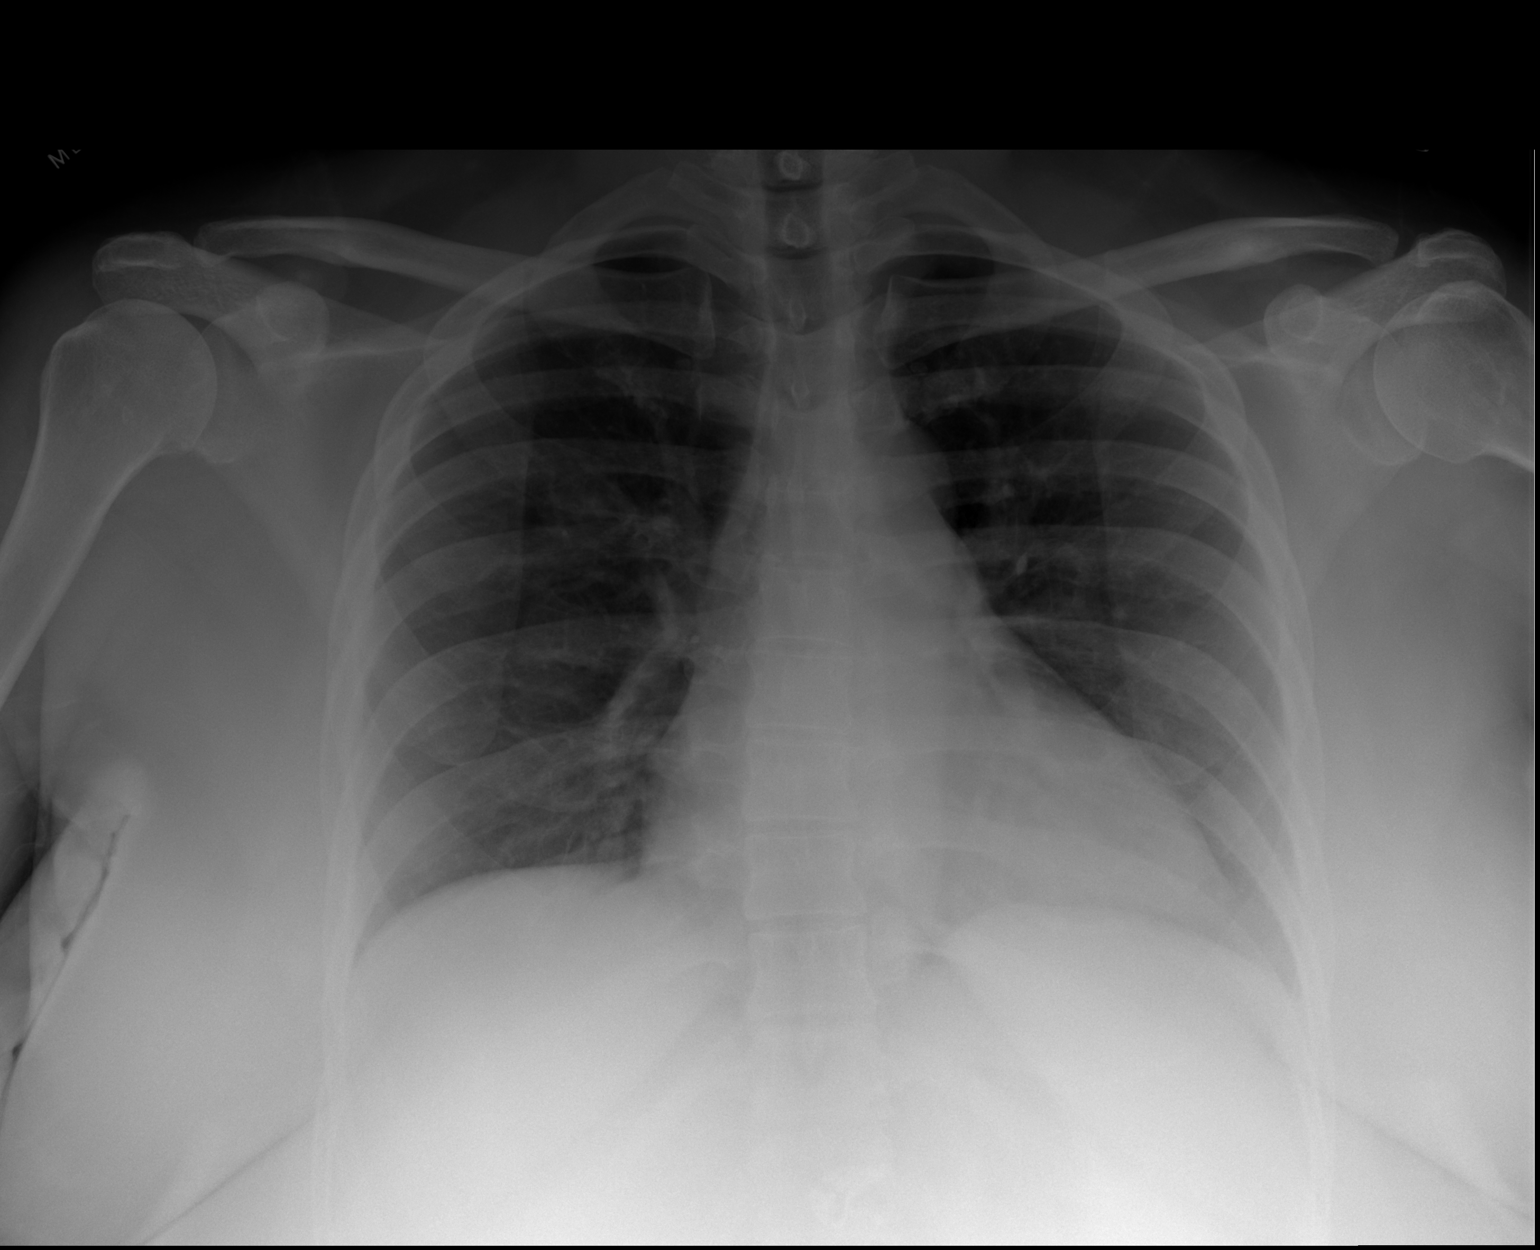

[2 of 2 positions shown; findings below may reference images not displayed]

FINDINGS: Lung volumes are normal. No consolidative airspace disease. No
pleural effusions. No pneumothorax. No pulmonary nodule or mass
noted. Pulmonary vasculature and the cardiomediastinal silhouette
are within normal limits.
IMPRESSION: No radiographic evidence of acute cardiopulmonary disease.

## 2018-06-25 IMAGING — CT CT ABD-PELV W/ CM
2 of 5 series · 15 of 46 positions shown, 17 images · IV contrast (ISOVUE)
Comparison: None.

CLINICAL DATA: Acute onset of generalized chest pressure and
abdominal pain. Drainage from bariatric surgery incision site.
Initial encounter.

EXAM:
CT ABDOMEN AND PELVIS WITH CONTRAST
TECHNIQUE: Multidetector CT imaging of the abdomen and pelvis was performed
using the standard protocol following bolus administration of
intravenous contrast.
CONTRAST:  100 mL [id number] IOPAMIDOL ([id number]) INJECTION 61%

[Series 2: abd/pel with · axial · 0.89mm/px · z∈[-594,-164]mm · 12 of 100 slices shown, 14 images]
[im 7/100  soft-tissue]
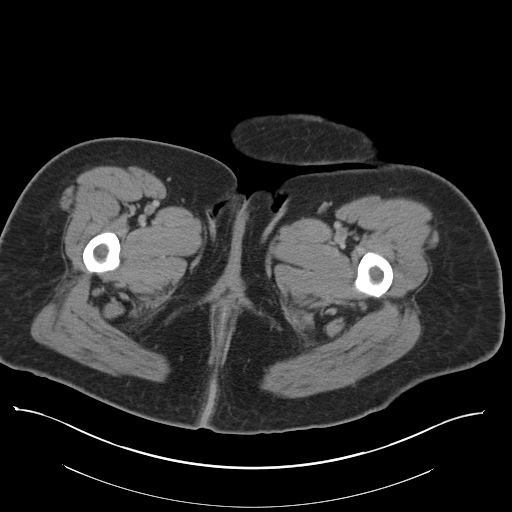
[im 7/100  bone]
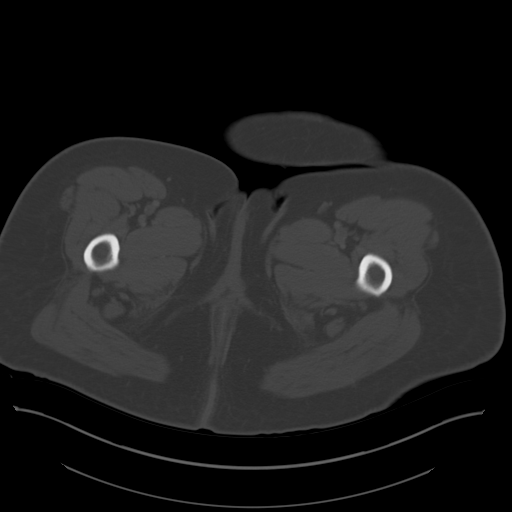
[im 13/100  soft-tissue]
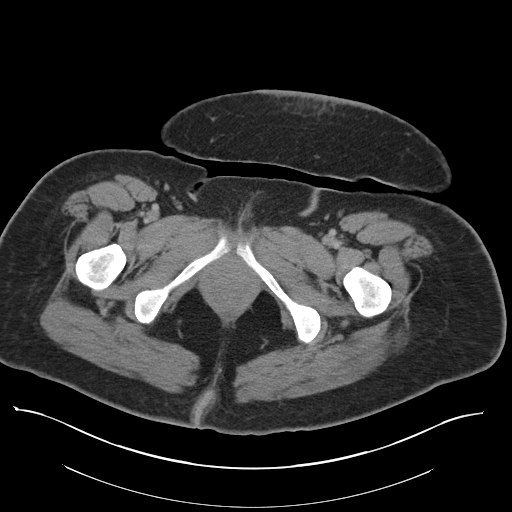
[im 25/100  soft-tissue]
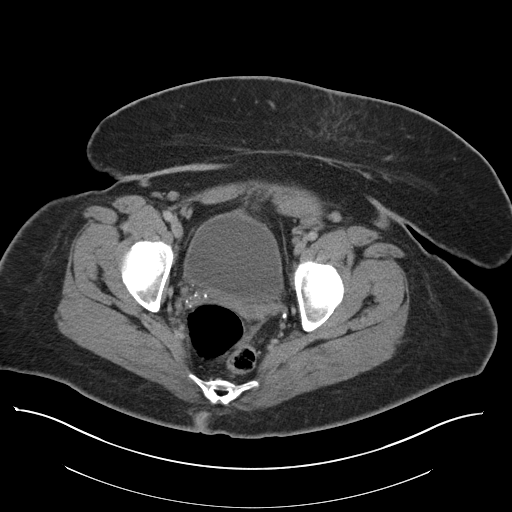
[im 31/100  soft-tissue]
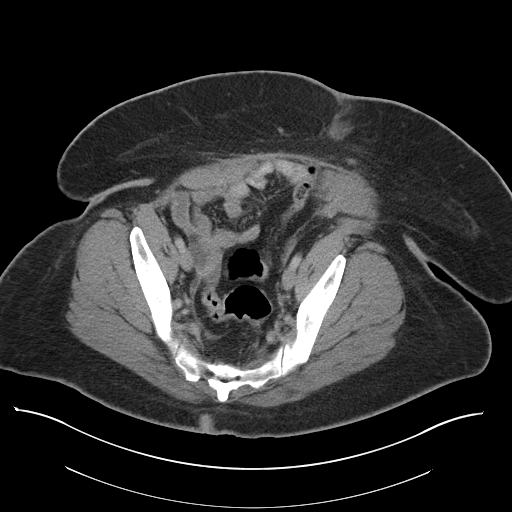
[im 38/100  soft-tissue]
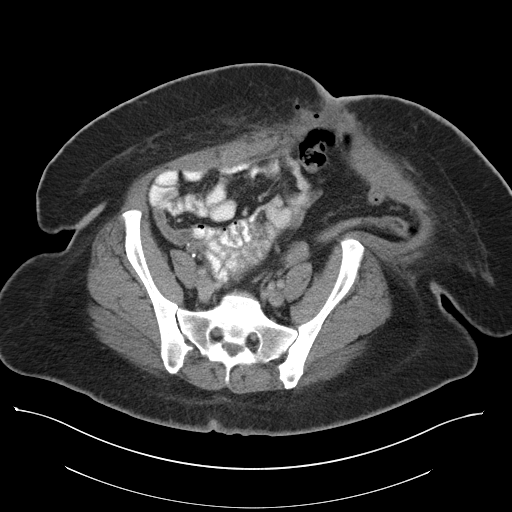
[im 44/100  soft-tissue]
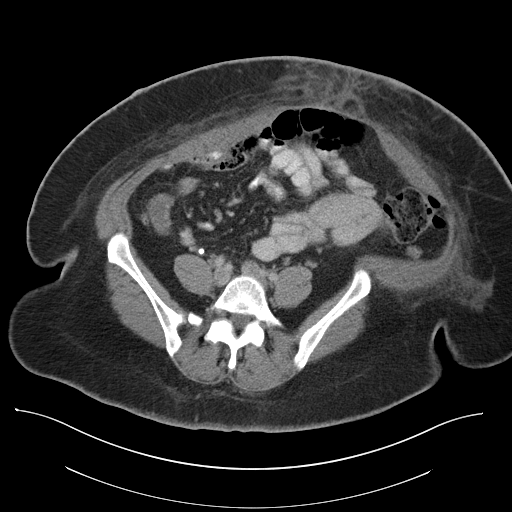
[im 56/100  soft-tissue]
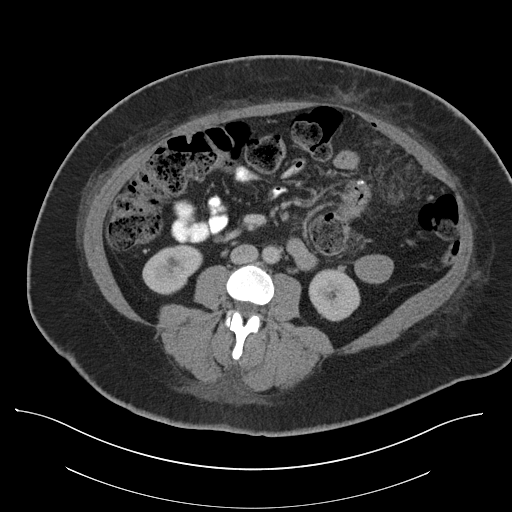
[im 62/100  soft-tissue]
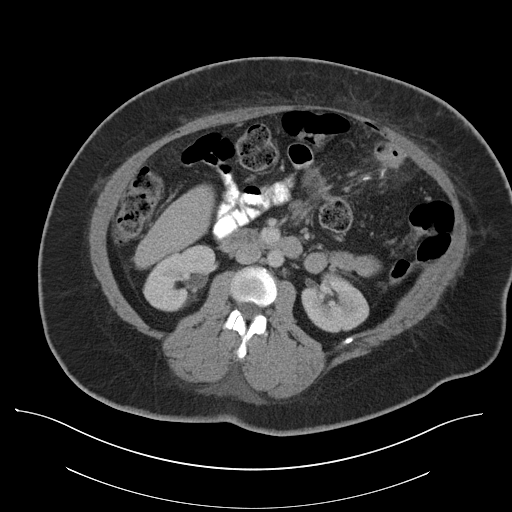
[im 69/100  soft-tissue]
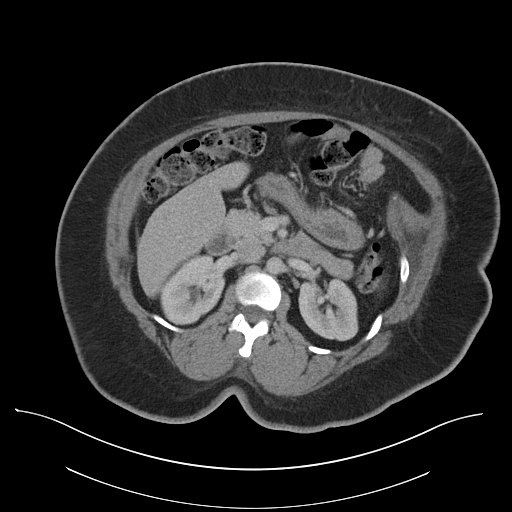
[im 69/100  bone]
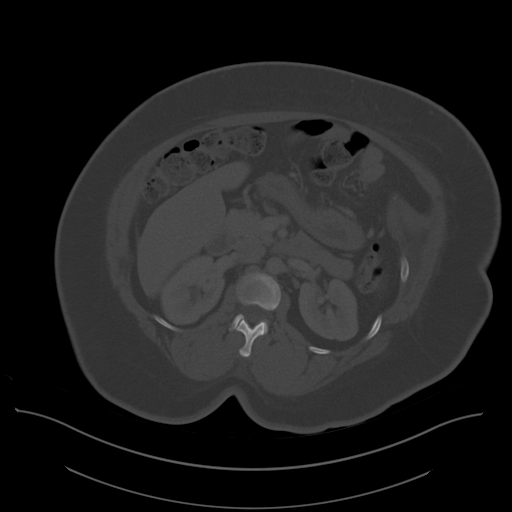
[im 75/100  soft-tissue]
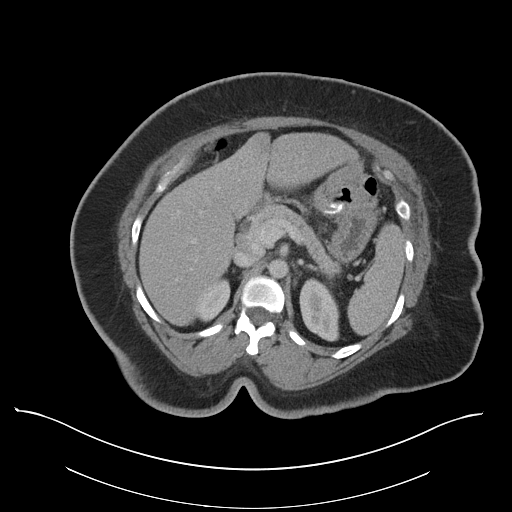
[im 87/100  soft-tissue]
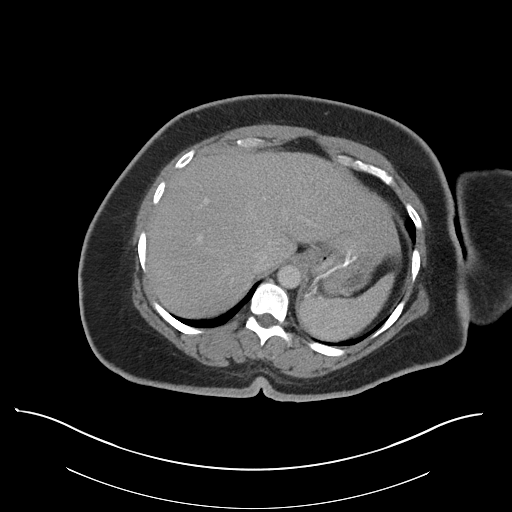
[im 93/100  soft-tissue]
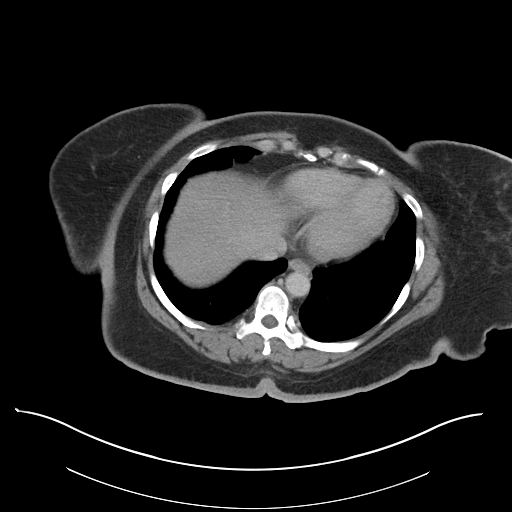

[Series 4: coronal a/|p · coronal · 0.80mm/px · 3 of 185 slices shown]
[im 62/185  soft-tissue]
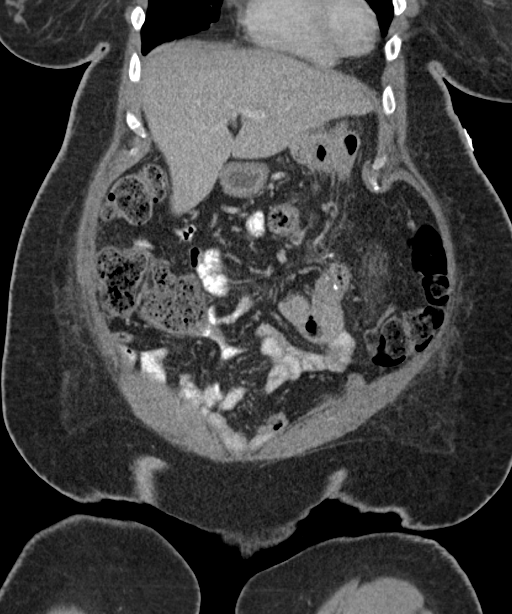
[im 82/185  soft-tissue]
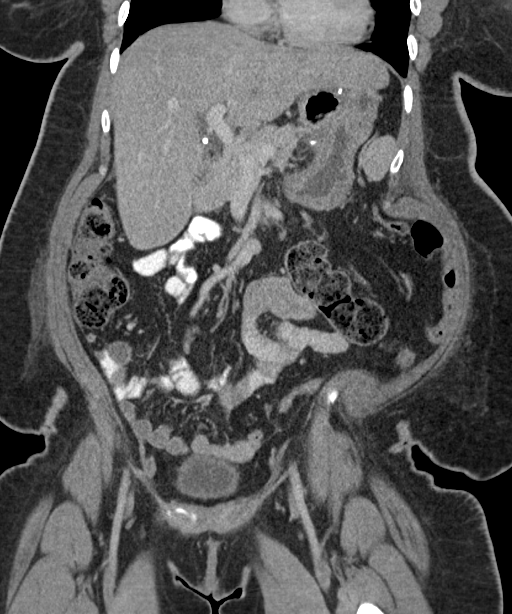
[im 103/185  soft-tissue]
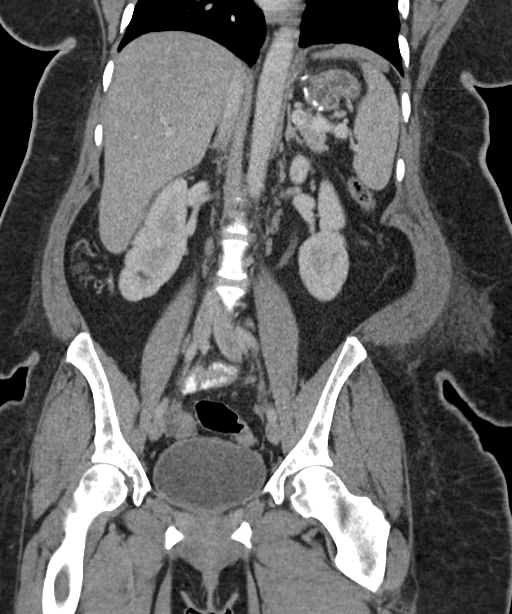

[15 of 46 positions shown; findings below may reference images not displayed]

FINDINGS: Lower chest: The visualized lung bases are clear. The visualized
portions of the mediastinum are unremarkable.

Hepatobiliary: The liver is unremarkable in appearance. The patient
is status post cholecystectomy, with clips noted at the gallbladder
fossa. The common bile duct remains normal in caliber.

Pancreas: The pancreas is within normal limits.

Spleen: The spleen is unremarkable in appearance.

Adrenals/Urinary Tract: The adrenal glands are unremarkable in
appearance. The kidneys are within normal limits. There is no
evidence of hydronephrosis. No renal or ureteral stones are
identified. No perinephric stranding is seen.

Stomach/Bowel: The gastrojejunal anastomosis is grossly unremarkable
in appearance, with minimal postoperative inflammation. The distal
stomach contains a small amount of fluid and is unremarkable in
appearance. The small bowel is within normal limits. The appendix is
not visualized; there is no evidence for appendicitis. The colon is
unremarkable in appearance.

Vascular/Lymphatic: The abdominal aorta is unremarkable in
appearance. The inferior vena cava is grossly unremarkable. No
retroperitoneal lymphadenopathy is seen. No pelvic sidewall
lymphadenopathy is identified.

Reproductive: The bladder is mildly distended and grossly
unremarkable. The patient is status post hysterectomy. No suspicious
adnexal masses are seen. The ovaries appear grossly symmetric.

Other: Soft tissue edema is seen tracking along the anterior
abdominal wall, with a collection of fluid and trace air noted just
inferior to the umbilicus, measuring 4.4 x 4.1 x 2.1 cm. This may
reflect a postoperative seroma, though evolving abscess cannot be
entirely excluded. Surrounding soft tissue inflammation is noted.
Minimal air is noted tracking about the rectus abdominis muscle more
superiorly.

Skin thickening is noted along the lower anterior abdominal wall.
Skin thickening is also noted along the upper back.

Musculoskeletal: No acute osseous abnormalities are identified. The
visualized musculature is unremarkable in appearance.
IMPRESSION: 1. Collection of fluid and trace air noted just inferior to the
umbilicus, measuring 4.4 x 4.1 x 2.1 cm. This may reflect a
postoperative seroma, though evolving abscess cannot be entirely
excluded. Surrounding soft tissue inflammation. Soft tissue edema
noted tracking along the anterior abdominal wall. Minimal air noted
tracking about the rectus abdominis muscle more superiorly.
2. Skin thickening along the lower anterior abdominal wall. Skin
thickening along the upper back.

## 2019-09-16 IMAGING — US US ASPIRATION
1 series · 10 of 10 positions shown · non-contrast
Comparison: none

INDICATION: Abdominal wall seroma in the umbilical region. Status post prior
gastric bypass in [REDACTED].

[Series 1: us aspiration · 0.06mm/px · 10 of 10 slices shown]
[im 1/10]
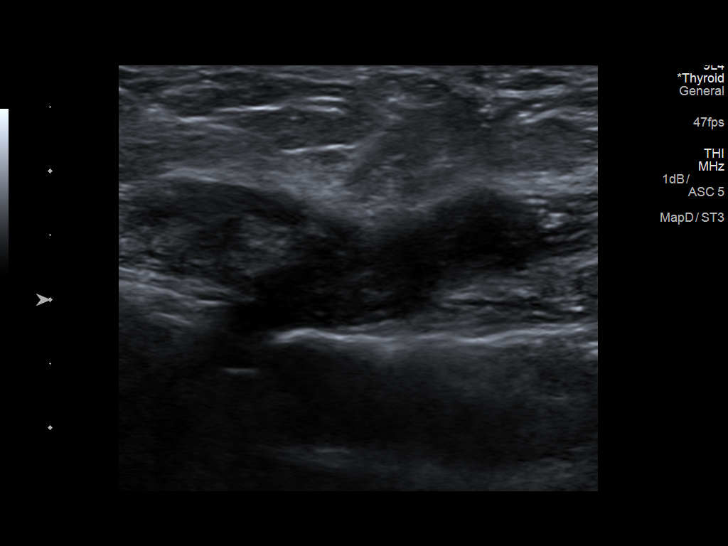
[im 2/10]
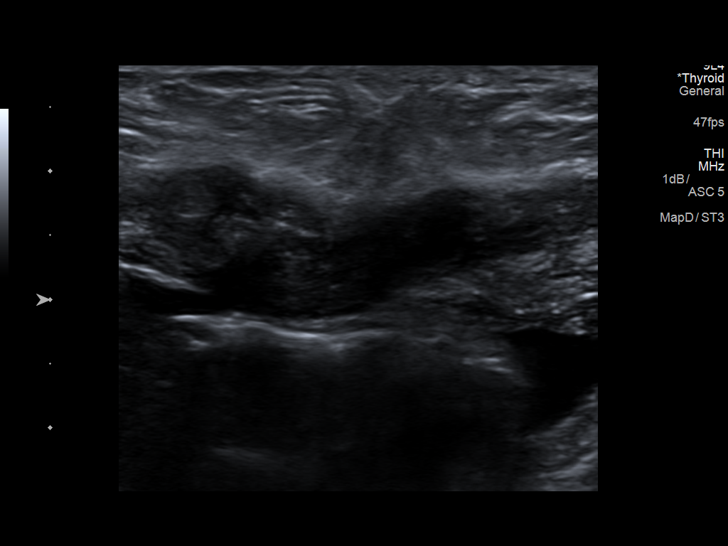
[im 3/10]
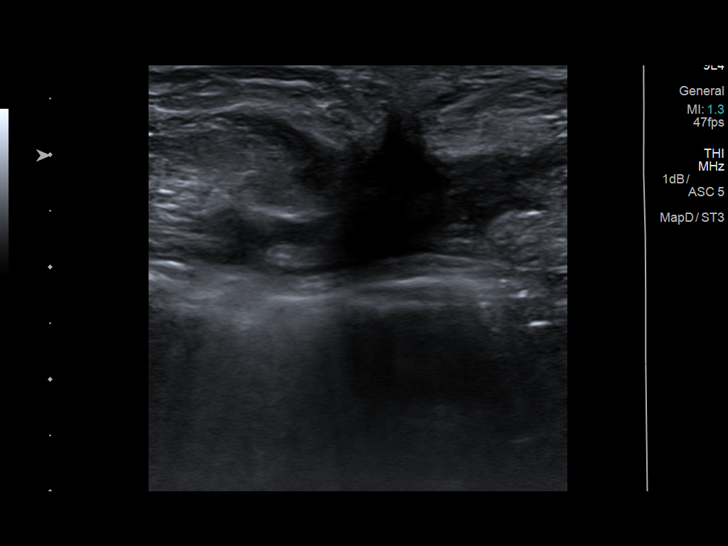
[im 4/10]
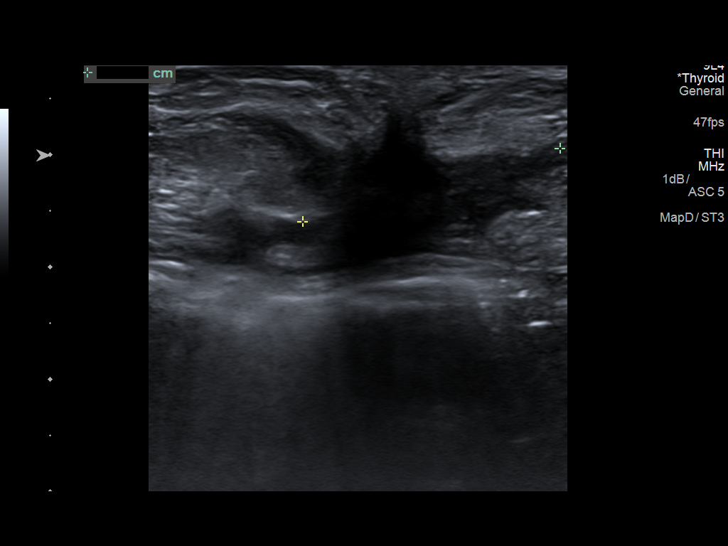
[im 5/10]
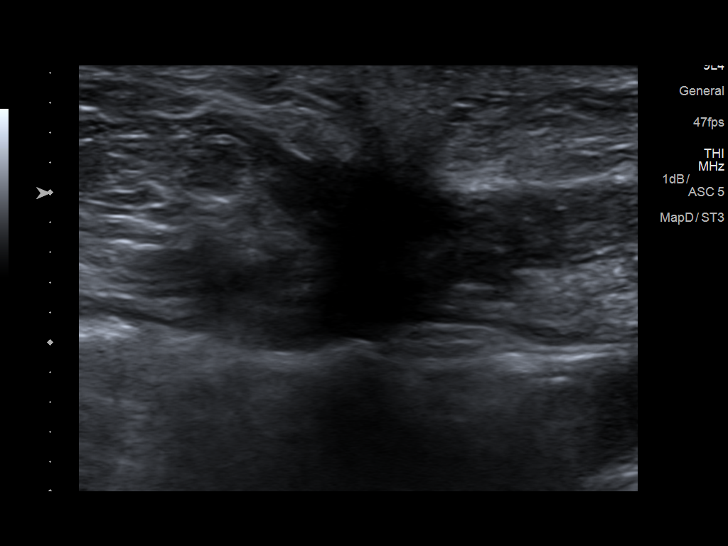
[im 6/10]
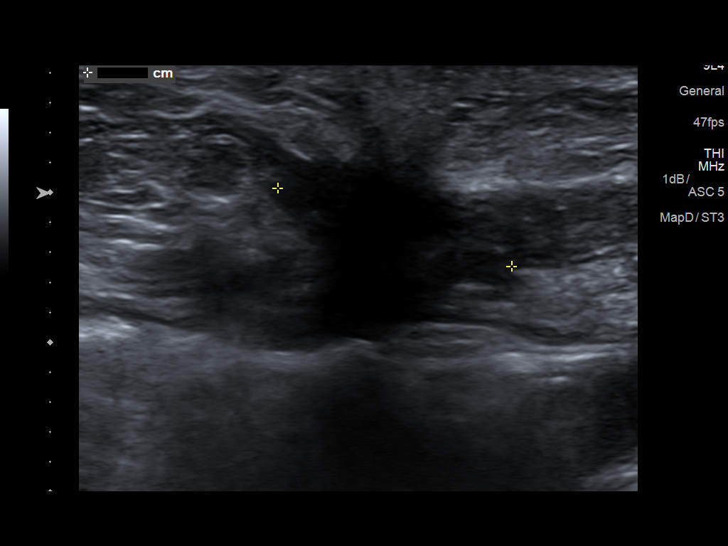
[im 7/10]
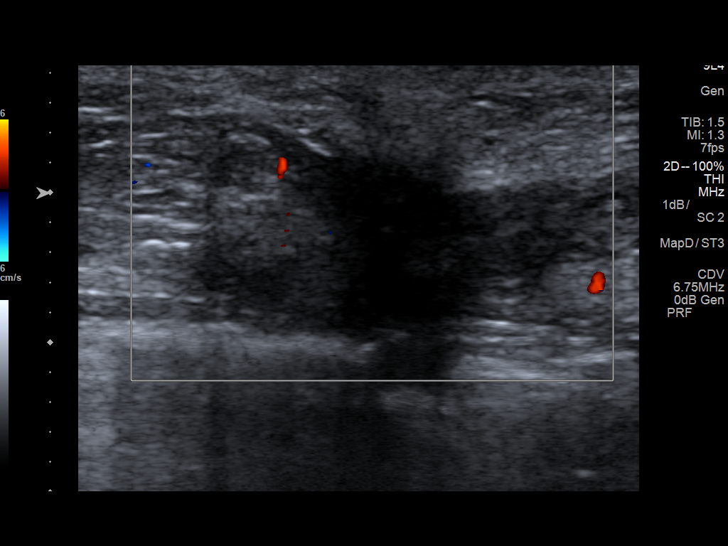
[im 8/10]
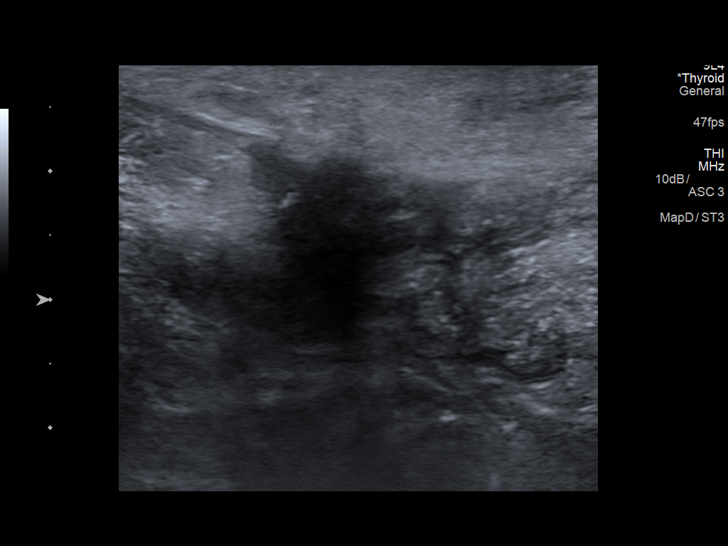
[im 9/10]
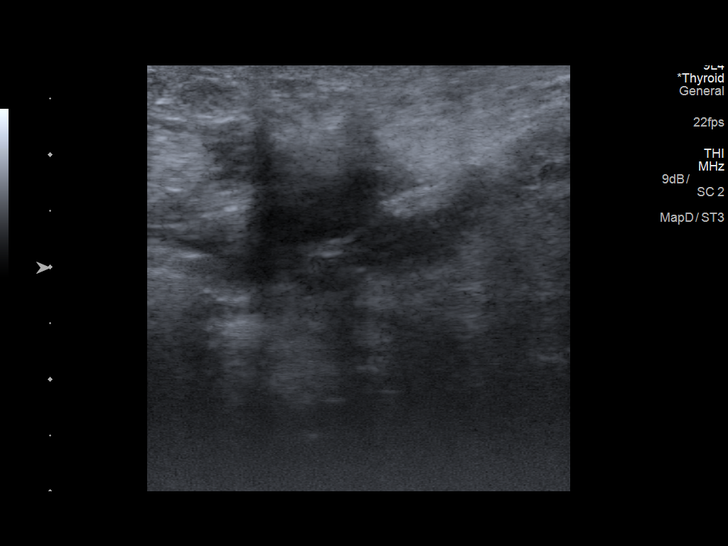
[im 10/10]
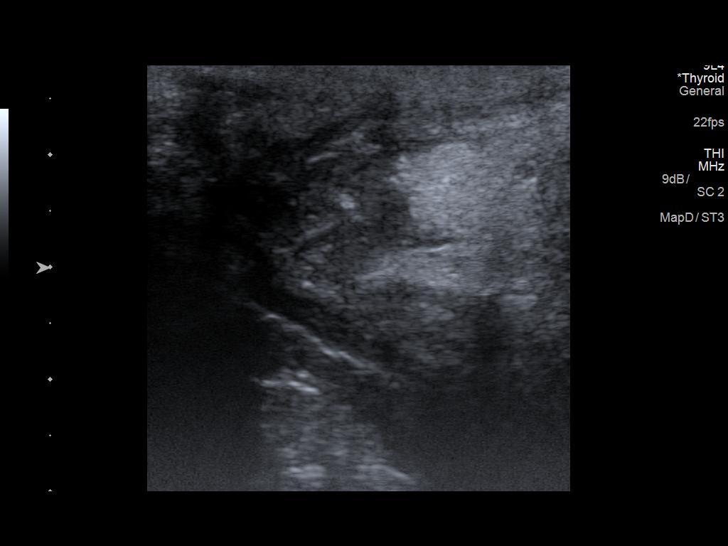

[10 of 10 positions shown; findings below may reference images not displayed]

EXAM:
ULTRASOUND-GUIDED ASPIRATION OF ABDOMINAL WALL FLUID COLLECTION

MEDICATIONS:
None

ANESTHESIA/SEDATION:
Fentanyl 100 mcg IV; Versed 2.0 mg IV

Moderate Sedation Time:  12 minutes.

The patient was continuously monitored during the procedure by the
interventional radiology nurse under my direct supervision.

COMPLICATIONS:
None immediate.

PROCEDURE:
Informed written consent was obtained from the patient after a
thorough discussion of the procedural risks, benefits and
alternatives. All questions were addressed. Maximal Sterile Barrier
Technique was utilized including caps, mask, sterile gowns, sterile
gloves, sterile drape, hand hygiene and skin antiseptic. A timeout
was performed prior to the initiation of the procedure.

Ultrasound was performed of the abdominal wall. After localizing a
periumbilical fluid collection, a 5 Jaylon Aujla centesis catheter
was advanced into the region under direct ultrasound guidance.
Aspiration was performed in multiple areas. Fluid sample was
combined with 1 mL of sterile saline and sent for culture analysis.

Findings go by ultrasound, a stellate area of relative hypoechoic
soft tissue is seen over a roughly 2.5 cm region. Aspiration did not
yield any significant fluid with only ability to withdrawal 1 mL of
bloody fluid. This was combined with some saline and sent for
culture analysis.
IMPRESSION: Aspiration at the level of complex appearing fluid in the
periumbilical abdominal wall yielded only roughly 1 mL of bloody
fluid. This was sent for culture analysis.
